# Patient Record
Sex: Male | Born: 1990 | Race: Black or African American | Hispanic: No | Marital: Single | Smoking: Never smoker
Health system: Southern US, Community
[De-identification: ages and names within clinical notes are randomized; demographics above are authoritative.]

## PROBLEM LIST (undated history)

## (undated) DIAGNOSIS — F32A Depression, unspecified: Secondary | ICD-10-CM

## (undated) DIAGNOSIS — J45909 Unspecified asthma, uncomplicated: Secondary | ICD-10-CM

## (undated) HISTORY — PX: OTHER SURGICAL HISTORY: SHX169

## (undated) HISTORY — DX: Depression, unspecified: F32.A

## (undated) HISTORY — DX: Unspecified asthma, uncomplicated: J45.909

---

## 2020-02-21 ENCOUNTER — Other Ambulatory Visit: Payer: Self-pay

## 2020-02-21 ENCOUNTER — Emergency Department (HOSPITAL_COMMUNITY)
Admission: EM | Admit: 2020-02-21 | Discharge: 2020-02-21 | Disposition: A | Discharge status: Home / Self Care | Payer: Self-pay | Attending: Emergency Medicine | Admitting: Emergency Medicine

## 2020-02-21 ENCOUNTER — Encounter (HOSPITAL_COMMUNITY): Payer: Self-pay | Admitting: Emergency Medicine

## 2020-02-21 ENCOUNTER — Emergency Department (HOSPITAL_COMMUNITY): Payer: Self-pay

## 2020-02-21 DIAGNOSIS — R0789 Other chest pain: Secondary | ICD-10-CM | POA: Insufficient documentation

## 2020-02-21 DIAGNOSIS — R42 Dizziness and giddiness: Secondary | ICD-10-CM | POA: Insufficient documentation

## 2020-02-21 DIAGNOSIS — R202 Paresthesia of skin: Secondary | ICD-10-CM | POA: Insufficient documentation

## 2020-02-21 DIAGNOSIS — R200 Anesthesia of skin: Secondary | ICD-10-CM | POA: Insufficient documentation

## 2020-02-21 DIAGNOSIS — R110 Nausea: Secondary | ICD-10-CM | POA: Insufficient documentation

## 2020-02-21 DIAGNOSIS — R002 Palpitations: Secondary | ICD-10-CM

## 2020-02-21 DIAGNOSIS — F419 Anxiety disorder, unspecified: Secondary | ICD-10-CM | POA: Insufficient documentation

## 2020-02-21 LAB — CBC
HCT: 48.4 % (ref 39.0–52.0)
Hemoglobin: 16.1 g/dL (ref 13.0–17.0)
MCH: 31.5 pg (ref 26.0–34.0)
MCHC: 33.3 g/dL (ref 30.0–36.0)
MCV: 94.7 fL (ref 80.0–100.0)
Platelets: 222 10*3/uL (ref 150–400)
RBC: 5.11 MIL/uL (ref 4.22–5.81)
RDW: 12.6 % (ref 11.5–15.5)
WBC: 6.3 10*3/uL (ref 4.0–10.5)
nRBC: 0 % (ref 0.0–0.2)

## 2020-02-21 LAB — BASIC METABOLIC PANEL
Anion gap: 13 (ref 5–15)
BUN: 5 mg/dL — ABNORMAL LOW (ref 6–20)
CO2: 24 mmol/L (ref 22–32)
Calcium: 9.3 mg/dL (ref 8.9–10.3)
Chloride: 102 mmol/L (ref 98–111)
Creatinine, Ser: 0.86 mg/dL (ref 0.61–1.24)
GFR calc Af Amer: >60 mL/min (ref >60)
GFR calc non Af Amer: >60 mL/min (ref >60)
Glucose, Bld: 99 mg/dL (ref 70–99)
Potassium: 3.7 mmol/L (ref 3.5–5.1)
Sodium: 139 mmol/L (ref 135–145)

## 2020-02-21 LAB — TROPONIN I (HIGH SENSITIVITY)
Troponin I (High Sensitivity): <2 ng/L (ref <18)
Troponin I (High Sensitivity): <2 ng/L (ref <18)

## 2020-02-21 LAB — TSH: TSH: 2.822 u[IU]/mL (ref 0.350–4.500)

## 2020-02-21 MED ORDER — SODIUM CHLORIDE 0.9% FLUSH: 3.0000 mL | Freq: Once | INTRAVENOUS | Status: DC

## 2020-02-21 NOTE — ED Notes (Signed)
Pt verbalized understanding of DC instructions and follow up care with cardiology/PCP. Ambulatory, refused wheelchair.

## 2020-02-21 NOTE — ED Provider Notes (Signed)
[facility]PART[facility] Provider Note   CSN: [id number] Arrival date & time: 02/21/20  [id number]     History Chief Complaint  Patient presents with  . Chest Pain    Keith Newman is a 29 y.o. male with no significant past medical history presents today for evaluation of acute onset, intermittent palpitations for a few months.  He reports episodes in which he feels more aware of his heartbeat and sometimes heart racing.  States that the majority of these episodes occur while exercising, typically doing cardio such as running or playing basketball but not weightlifting.  He states the episodes will come on and last for approximately 2 minutes before resolving with rest and "my adrenaline going down".  He states that during these episodes he will begin to feel lightheaded and will experience a sharp left upper chest pain.  Sometimes he will feel the palpitations in his head but denies significant headache.  No vision changes.  Earlier today at 82 AM while watching television he had a similar episode of palpitations and left-sided chest pains.  It lasted a little bit longer, approximately 7 minutes and he states "I think it lasted longer because I worked myself up".  He states that he then began to research his condition and when he read that he could experience numbness in his arm he began to feel left upper extremity numbness and tingling intermittently. Has not tried anything for his symptoms.  Presently endorses feeling anxious and a little nauseous but otherwise asymptomatic.  He denies recent travel or surgeries, hemoptysis, prior history of DVT or PE, and is not on hormone replacement.  He is a never smoker, denies recreational drug use or alcohol use.  He also denies excessive caffeine intake.  No family history of heart disease at a young age.  The history is provided by the patient.       History reviewed. No pertinent past medical history.  There are no problems  to display for this patient.   History reviewed. No pertinent surgical history.     No family history on file.  Social History   Tobacco Use  . Smoking status: Never Smoker  . Smokeless tobacco: Never Used  Substance Use Topics  . Alcohol use: Not Currently  . Drug use: Not Currently    Home Medications Prior to Admission medications   Not on File    Allergies    Patient has no allergy information on record.  Review of Systems   Review of Systems  Constitutional: Negative for chills and fever.  Respiratory: Positive for shortness of breath.   Cardiovascular: Positive for chest pain.  Gastrointestinal: Negative for abdominal pain, nausea and vomiting.  Neurological: Positive for light-headedness and numbness. Negative for weakness.  All other systems reviewed and are negative.   Physical Exam Updated Vital Signs BP 118/75 (BP Location: Left Arm)   Pulse 71   Temp 98.3 F (36.8 C) (Oral)   Resp 18   Ht 6\' 3"  (1.905 m)   Wt 77.1 kg   SpO2 99%   BMI 21.25 kg/m   Physical Exam Vitals and nursing note reviewed.  Constitutional:      General: He is not in acute distress.    Appearance: He is well-developed.  HENT:     Head: Normocephalic and atraumatic.  Eyes:     General:        Right eye: No discharge.        Left eye: No  discharge.     Conjunctiva/sclera: Conjunctivae normal.  Neck:     Vascular: No JVD.     Trachea: No tracheal deviation.  Cardiovascular:     Rate and Rhythm: Normal rate and regular rhythm.     Pulses:          Carotid pulses are 2+ on the right side and 2+ on the left side.      Radial pulses are 2+ on the right side and 2+ on the left side.       Dorsalis pedis pulses are 2+ on the right side and 2+ on the left side.       Posterior tibial pulses are 2+ on the right side and 2+ on the left side.     Heart sounds: Normal heart sounds.     Comments: Homans sign absent bilaterally, no lower extremity edema, no palpable cords,  compartments are soft  Pulmonary:     Effort: Pulmonary effort is normal.  Chest:     Chest wall: No tenderness.  Abdominal:     General: There is no distension.  Musculoskeletal:     Cervical back: Normal range of motion and neck supple.     Right lower leg: No tenderness. No edema.     Left lower leg: No tenderness. No edema.  Skin:    General: Skin is warm and dry.     Capillary Refill: Capillary refill takes less than 2 seconds.     Findings: No erythema.  Neurological:     Mental Status: He is alert.     Comments: Mental Status:  Alert, thought content appropriate, able to give a coherent history. Speech fluent without evidence of aphasia. Able to follow 2 step commands without difficulty.  Cranial Nerves:  II:  Peripheral visual fields grossly normal, pupils equal, round, reactive to light III,IV, VI: ptosis not present, extra-ocular motions intact bilaterally  V,VII: smile symmetric, facial light touch sensation equal VIII: hearing grossly normal to voice  X: uvula elevates symmetrically  XI: bilateral shoulder shrug symmetric and strong XII: midline tongue extension without fassiculations Motor:  Normal tone. 5/5 strength of BUE and BLE major muscle groups including strong and equal grip strength and dorsiflexion/plantar flexion Sensory: light touch normal in all extremities.   Psychiatric:        Behavior: Behavior normal.     ED Results / Procedures / Treatments   Labs (all labs ordered are listed, but only abnormal results are displayed) Labs Reviewed  BASIC METABOLIC PANEL - Abnormal; Notable for the following components:      Result Value   BUN 5 (*)    All other components within normal limits  CBC  TSH  TROPONIN I (HIGH SENSITIVITY)  TROPONIN I (HIGH SENSITIVITY)    EKG EKG Interpretation  Date/Time:  Wednesday February 21 2020 10:07:47 EDT Ventricular Rate:  89 PR Interval:  140 QRS Duration: 84 QT Interval:  338 QTC Calculation: 411 R Axis:    91 Text Interpretation: Normal sinus rhythm with sinus arrhythmia Rightward axis Borderline ECG artifact in multiple leads Confirmed by Davonna Belling [phone]) on 02/21/2020 11:14:00 AM   Radiology DG Chest 2 View  Result Date: 02/21/2020 CLINICAL DATA:  Chest pain. EXAM: CHEST - 2 VIEW COMPARISON:  None. FINDINGS: The heart size and mediastinal contours are within normal limits. The lungs are clear. The visualized skeletal structures are unremarkable. IMPRESSION: Negative chest. Electronically Signed   By: Inge Rise M.D.   On: 02/21/2020 10:46  Procedures Procedures (including critical care time)  Medications Ordered in ED Medications  sodium chloride flush (NS) 0.9 % injection 3 mL (3 mLs Intravenous Not Given 02/21/20 1131)    ED Course  I have reviewed the triage vital signs and the nursing notes.  Pertinent labs & imaging results that were available during my care of the patient were reviewed by me and considered in my medical decision making (see chart for details).    MDM Rules/Calculators/A&P                      Patient presenting for evaluation of intermittent palpitations and left-sided chest pains for several months.  Typically comes on with exertion but today happened at rest.  He is asymptomatic upon my assessment.  He is afebrile, vital signs are stable.  He is nontoxic but anxious in appearance.  The pain is not reproducible on palpation.  Lab work reviewed and interpreted by myself shows no leukocytosis, no anemia, no metabolic derangements, no renal insufficiency.  His TSH is within normal limits.  His EKG shows normal sinus rhythm with no acute ischemic abnormalities and repeat EKG is stable and serial troponins are negative.  Doubt ACS/MI at this time.  His pulses are equal and brisk bilaterally, doubt coarctation of the aorta.  His chest x-ray shows no acute cardiopulmonary abnormalities, no cardiomegaly or widened mediastinum.  Doubt dissection, cardiac  tamponade, esophageal rupture.  On reevaluation the patient is resting comfortably in no apparent distress.  His sister is now at the bedside and she does note that their mother has a history of enlarged heart but she is unsure when this was diagnosed.  I encouraged the patient to hold off on any exertional activity until he follows up with PCP or cardiology on an outpatient basis.  We will give him resources for follow-up with [facility] wellness.  Discussed strict ED return precautions. Patient and sister verbalized understanding of and agreement with plan and is safe for discharge home at this time.    Final Clinical Impression(s) / ED Diagnoses Final diagnoses:  Atypical chest pain  Palpitations    Rx / DC Orders ED Discharge Orders         Ordered    Ambulatory referral to Cardiology    Comments: Palpitations and lightheadedness with exercise   02/21/20 1509           Renita Papa, PA-C 02/21/20 1545    Davonna Belling, MD 02/22/20 [phone]

## 2020-02-21 NOTE — Discharge Instructions (Signed)
Your work-up today was reassuring.  However I would recommend that you stop doing any kind of significant physical activity/exertion until follow-up with PCP or cardiology for reevaluation.   Call [facility] today or first thing tomorrow to schedule follow-up appointment.  I have also put in referral to our cardiologists.  They should call you within around 48 hours to schedule follow-up.  If you do not hear from them you can call to schedule follow-up.  Return to the emergency department if any concerning signs or symptoms develop such as lightheadedness, shortness of breath, severe pains, loss of consciousness.

## 2020-02-21 NOTE — ED Triage Notes (Signed)
Pt states he has been having "palpitations in his head" after exercising x 2 months.  States this morning he had sharp 3/10 L sided chest pain that started around 6am and lasted for approx 2 hours.  Also reports L arm numbness that lasted for a few minutes.  No arm drift.

## 2020-02-29 ENCOUNTER — Encounter: Payer: Self-pay | Admitting: Family Medicine

## 2020-02-29 ENCOUNTER — Ambulatory Visit: Payer: Self-pay | Attending: Family Medicine | Admitting: Family Medicine

## 2020-02-29 ENCOUNTER — Other Ambulatory Visit: Payer: Self-pay

## 2020-02-29 DIAGNOSIS — R002 Palpitations: Secondary | ICD-10-CM

## 2020-02-29 DIAGNOSIS — Z09 Encounter for follow-up examination after completed treatment for conditions other than malignant neoplasm: Secondary | ICD-10-CM

## 2020-02-29 DIAGNOSIS — G43909 Migraine, unspecified, not intractable, without status migrainosus: Secondary | ICD-10-CM | POA: Insufficient documentation

## 2020-02-29 DIAGNOSIS — J4520 Mild intermittent asthma, uncomplicated: Secondary | ICD-10-CM

## 2020-02-29 DIAGNOSIS — G43709 Chronic migraine without aura, not intractable, without status migrainosus: Secondary | ICD-10-CM

## 2020-02-29 DIAGNOSIS — Z7689 Persons encountering health services in other specified circumstances: Secondary | ICD-10-CM

## 2020-02-29 DIAGNOSIS — R0789 Other chest pain: Secondary | ICD-10-CM

## 2020-02-29 MED ORDER — ALBUTEROL SULFATE HFA 108 (90 BASE) MCG/ACT IN AERS: 2.0000 | INHALATION_SPRAY | Freq: Four times a day (QID) | RESPIRATORY_TRACT | 11 refills | Status: AC | PRN

## 2020-02-29 MED FILL — !VENTOLIN HFA INHALER: 108 (90 BAS | 25 days supply | Qty: 18 | Fill #0

## 2020-02-29 NOTE — Progress Notes (Signed)
Virtual Visit via Telephone Note  I connected with Keith Newman on 02/29/20 at  9:30 AM EDT by telephone and verified that I am speaking with the correct person using two identifiers.   I discussed the limitations, risks, security and privacy concerns of performing an evaluation and management service by telephone and the availability of in person appointments. I also discussed with the patient that there may be a patient responsible charge related to this service. The patient expressed understanding and agreed to proceed.  Patient Location: Home Provider Location: [facility] Others participating in call: none  Call placed to patient at 9:47 am but no one answered. Keith Grain, MD  History of Present Illness:        29 yo male new to the practice who is status post ED visit on 02/21/2020 due to left sided chest pain and numbness in his left arm. He was afraid that he might be having a heart attack. He was also having occasional palpitations. Since his ED visit, he will still have random sharp in his left arm but this does not go away until he repositions himself or lies down on his right side.  The numbness sensation is mostly in the left shoulder and biceps area.  No numbness or tingling in the hand.  When he had the episode of chest pain or when he has palpitations, he does not have any sweating/diaphoresis and no nausea.  No radiation of pain into the throat, neck or jaw or upper back.         He reports past medical history of migraines which started about a year ago and when he has a migraine headache it starts at the top of his head and he has sensitivity to light.  He currently does not take any medication to help with the migraines but did take medication a year ago.  He reports that he may have up to 10 migraine type headaches in a 30-day period.  He sometimes also has nausea with his migraine type headaches.  He does not get a warning that the headaches are about to occur but headaches will  start more gradually and then increase in intensity.         He also has past medical history significant for asthma.  He states that he had more issues when he was younger with asthma but now his asthma is not really a factor.  Asthma can occur if he has been very active.  He has noted recent increase in shortness of breath if he is doing an activity such as going up or down stairs but denies any chest tightness or wheezing.  He does not currently have an albuterol inhaler will but would like to have one sent to pharmacy.          When he has palpitations, these usually occur with exertion/increase level of activity.  Palpitations feel as if he can hear his heartbeat in his head and he feels the sensation of palpitations throughout his body.  He reports no family history of heart disease or heart attack but states that his mother was recently diagnosed with an enlarged heart after she was seen in the emergency department.  Patient's mother has hypertension and diabetes.  Past Medical History:  Diagnosis Date  . Asthma     History reviewed. No pertinent surgical history.  Family History  Problem Relation Age of Onset  . Prostate cancer Paternal Uncle     Social History  Tobacco Use  . Smoking status: Never Smoker  . Smokeless tobacco: Never Used  Substance Use Topics  . Alcohol use: Not Currently  . Drug use: Not Currently     No Known Allergies     Observations/Objective: No vital signs or physical exam conducted as visit was done via telephone  Assessment and Plan: 1. Atypical chest pain 2. Palpitations 6.  Encounter for examination following treatment at hospital; 7.  Encounter to establish care Emergency department records from patient's ED visit on 02/21/2020 reviewed and discussed with the patient and he will be referred to cardiology for further evaluation and treatment.  He has also been asked to make follow-up appointment here in the office for in person evaluation as his  shoulder pain and numbness may be musculoskeletal in nature. - Ambulatory referral to Cardiology  3. Chronic migraine without aura without status migrainosus, not intractable Patient may try over-the-counter medicine such as Excedrin Migraine to see if this helps with his recurrent headaches.  He has been asked to follow-up in office for further evaluation of headaches.  4. Mild intermittent asthma without complication Prescription provided for albuterol inhaler to use as needed for mild intermittent asthma - albuterol (VENTOLIN HFA) 108 (90 Base) MCG/ACT inhaler; Inhale 2 puffs into the lungs every 6 (six) hours as needed for wheezing or shortness of breath.  Dispense: 18 g; Refill: 11  Follow Up Instructions:Return in about 5 weeks (around 04/04/2020) for Follow-up of chest pain/palpitations/migraines and asthma;.sooner if needed    I discussed the assessment and treatment plan with the patient. The patient was provided an opportunity to ask questions and all were answered. The patient agreed with the plan and demonstrated an understanding of the instructions.   The patient was advised to call back or seek an in-person evaluation if the symptoms worsen or if the condition fails to improve as anticipated.  I provided 18 minutes of non-face-to-face time during this encounter. Additional time spent for review of chart, placement of orders and completion of today's visit, approximately 10 minutes  Keith Wellons, MD

## 2020-02-29 NOTE — Progress Notes (Signed)
hfu   Per pt he was dx with asthma but have not been using any inhaler  Shark pain in his back about 5 since his first emergency check up and its been intermittent

## 2020-03-04 ENCOUNTER — Encounter: Payer: Self-pay | Admitting: General Practice

## 2020-03-19 ENCOUNTER — Encounter: Payer: Self-pay | Admitting: Family

## 2020-03-19 ENCOUNTER — Ambulatory Visit: Payer: Self-pay | Attending: Family | Admitting: Family

## 2020-03-19 ENCOUNTER — Other Ambulatory Visit: Payer: Self-pay

## 2020-03-19 VITALS — BP 124/76 | HR 85 | Temp 98.6°F | Resp 16 | Ht 75.0 in | Wt 193.0 lb

## 2020-03-19 DIAGNOSIS — G43709 Chronic migraine without aura, not intractable, without status migrainosus: Secondary | ICD-10-CM

## 2020-03-19 DIAGNOSIS — R002 Palpitations: Secondary | ICD-10-CM

## 2020-03-19 DIAGNOSIS — F329 Major depressive disorder, single episode, unspecified: Secondary | ICD-10-CM

## 2020-03-19 DIAGNOSIS — F419 Anxiety disorder, unspecified: Secondary | ICD-10-CM

## 2020-03-19 DIAGNOSIS — R0789 Other chest pain: Secondary | ICD-10-CM

## 2020-03-19 NOTE — Patient Instructions (Signed)
Referral to cardiology for atypical chest pain. Excedrin Migraine for migraines. Follow-up with primary doctor as needed. Chest Wall Pain Chest wall pain is pain in or around the bones and muscles of your chest. Chest wall pain may be caused by:  An injury.  Coughing a lot.  Using your chest and arm muscles too much. Sometimes, the cause may not be known. This pain may take a few weeks or longer to get better. Follow these instructions at home: Managing pain, stiffness, and swelling If told, put ice on the painful area:  Put ice in a plastic bag.  Place a towel between your skin and the bag.  Leave the ice on for 20 minutes, 2-3 times a day.  Activity  Rest as told by your doctor.  Avoid doing things that cause pain. This includes lifting heavy items.  Ask your doctor what activities are safe for you. General instructions   Take over-the-counter and prescription medicines only as told by your doctor.  Do not use any products that contain nicotine or tobacco, such as cigarettes, e-cigarettes, and chewing tobacco. If you need help quitting, ask your doctor.  Keep all follow-up visits as told by your doctor. This is important. Contact a doctor if:  You have a fever.  Your chest pain gets worse.  You have new symptoms. Get help right away if:  You feel sick to your stomach (nauseous) or you throw up (vomit).  You feel sweaty or light-headed.  You have a cough with mucus from your lungs (sputum) or you cough up blood.  You are short of breath. These symptoms may be an emergency. Do not wait to see if the symptoms will go away. Get medical help right away. Call your local emergency services (911 in the [place] Do not drive yourself to the hospital. Summary  Chest wall pain is pain in or around the bones and muscles of your chest.  It may be treated with ice, rest, and medicines. Your condition may also get better if you avoid doing things that cause pain.  Contact a  doctor if you have a fever, chest pain that gets worse, or new symptoms.  Get help right away if you feel light-headed or you get short of breath. These symptoms may be an emergency. This information is not intended to replace advice given to you by your health care provider. Make sure you discuss any questions you have with your health care provider. Document Revised: 05/26/2018 Document Reviewed: 05/26/2018 Elsevier Patient Education  2020 [employer].

## 2020-03-19 NOTE — Progress Notes (Signed)
Here for f /u chest  palpitation   C /o few seconds of various episodes of L eye visual disturbances with loss of Periferal and flashes X 6 wk  Made NP aware of anxiety and depression  screening questionaire

## 2020-03-19 NOTE — Progress Notes (Signed)
Patient ID: Keith Newman, male    DOB: October 26, 1991  MRN: [id number]  CC: Chest pain follow-up   Subjective: Keith Newman is a 29 y.o. male with history of migraines and asthma who presents for chest pain follow-up.  1. CHEST PAIN FOLLOW-UP: Location: left side of chest Description: 2/10 pain rating. Reports not really painful just scares him when it happens Onset: February 2021  Symptoms Left shoulder pain, numbness, tingling: denies Trauma: denies Nausea/vomiting: denies  Diaphoresis: denies Palpitations: yes, sometimes especially if working out Shortness of breath: yes, sometimes Cough: denies Edema: denies Orthopnea: denies Syncope: denies Indigestion: denies  Red Flags Worse with exertion: Yes, especially if working out. Reports he has not worked out since last visit in March 2021 as advised from the doctor at the hospital. Recent Immobility: denies Cancer history: denies Tearing/radiation to back: denies   Comments: Today not having any chest pain. Chest pain last happened 2 weeks ago which he describes as feeling like he was punched in the chest. Reports chest pain does not happen often stating sometimes a week can go by without chest pain and then randomly happens. States chest pain comes and goes. Reports typically has a rapid heartbeat especially while working out. Reports a few months ago while watching a cartoon on television one of the characters said he felt like he was having a heart attack and he immediately felt he was having a heart attack as well with left chest pain.  Last visit March 2021 with Dr. Chapman Fitch. During that encounter patient referred to cardiology for further evaluation and treatment of chest pain. Patient was also asked to make a follow-up appointment for shoulder pain and numbness which may have been suggestive of musculoskeletal in nature. Today patient reports that he has not been seen by cardiology as of yet and no longer having any shoulder  pain or numbness.    2. MIGRAINE FOLLOW-UP:  Description of Headaches:  Location of pain: reports headache location varies from front, side, and back of head Radiation of pain?:neck and left side of chest Character of pain:pulsating Severity of pain: 8 Accompanying symptoms: none Prodromal sx?: none Rapidity of onset: sudden Typical duration of individual headache: 3 minutes and comes and goes Are most headaches similar in presentation? yes Comments: At today's visit he does not have a headache. Reports when migraines happens they are strong and feels like he may pass out but he never does. Reports when laying down can feel hearbeat in the back of head and in the stomach. Reports does have ringing in bilateral ears which happens randomly. Reports when he moves his head right eye vision turns completely white and left eye vision goes completely black. Reports once that passes vision in bilateral eyes return to normal. Reports sometimes he sees colors like blue and yellow when they really aren't there or a giant dark circle in his peripheral area. Patient reports last week he went to see an eye doctor and was told that he does not have retinal detachment or glaucoma as he has family history of dad with glaucoma. Reports during his visit at the eye doctor his vision was determined to be good.  Last visit March 2021 with Dr. Chapman Fitch. During that encounter it was recommended patient try over-the-counter medication such as Excedrin Migraine to see if it helps recurrent headaches and to follow-up as needed in office. Reports did not try Excedrin Migraine since last visit or any additional medications over-the-counter. Patient reports he will try  Excedrin Migraine starting after this visit.  3. ANXIETY AND DEPRESSION:  Patient reports anxiety began around September 2020 when his vision started to change such as seeing colors and images which really weren't there. Reports anxiety became worse in February 2021  when he began to have chest pain. States that he is worried that he will lose his vision completely and wake up one day and be unable to see. Also feels worried that he will have a heart attack. Says that he is concerned that the migraines are actually a brain tumor and wonders if he should see a neurologist to make sure that he is okay. Says that he looks up most of his symptoms on Google and it worries him because he feels like in most cases Google says a person will die if they have some of the things he looks up. States that he knows its bad to keep looking on Google but he cant stop.  Reports depression began during the school years as a child. Reports that he was never felt that he fit in with other children his age. States that he has never been able to keep friendships for long because the always "flake" on him. Reports depression heightened as he entered the college years. Reports that he had plans to play basketball but his history of asthma prevented him from doing so in combination with a knee/leg injury. Reports that he currently attends [employer] and originally had plans to graduate spring 2021. Reports however he will not be able to graduate as intended because of academic and financial reasons and will more than likely have to attend college for an additional semester. States that he also feels depressed because he feels as if he is getting older and had intentions of being married by the age of 75 with at least a couple of children by now. Reports that he never wanted to live his 20's as the "fun years" but he wanted to be settled down as a family man and concerned that he may be a father for the first time at or after the age of 69.   States that his support system are his mother and 5 sisters.   Denies thoughts and plans self-harm and suicidal ideation. Not interested in medication at this time.  GAD 7 : Generalized Anxiety Score 03/19/2020 02/29/2020  Nervous, Anxious, on  Edge 3 1  Control/stop worrying 3 1  Worry too much - different things 3 1  Trouble relaxing 3 0  Restless 0 0  Easily annoyed or irritable 0 0  Afraid - awful might happen 3 1  Total GAD 7 Score 15 4  Anxiety Difficulty - Somewhat difficult   Depression screen [facility] 2/9 03/19/2020 02/29/2020  Decreased Interest 3 0  Down, Depressed, Hopeless 3 0  PHQ - 2 Score 6 0  Altered sleeping 0 0  Tired, decreased energy 0 0  Change in appetite 0 0  Feeling bad or failure about yourself  1 1  Trouble concentrating 0 0  Moving slowly or fidgety/restless 0 0  Suicidal thoughts 0 0  PHQ-9 Score 7 1  Difficult doing work/chores - Not difficult at all   Patient Active Problem List   Diagnosis Date Noted  . Migraines 02/29/2020     Current Outpatient Medications on File Prior to Visit  Medication Sig Dispense Refill  . albuterol (VENTOLIN HFA) 108 (90 Base) MCG/ACT inhaler Inhale 2 puffs into the lungs every 6 (six) hours as  needed for wheezing or shortness of breath. 18 g 11   No current facility-administered medications on file prior to visit.    No Known Allergies  Social History   Socioeconomic History  . Marital status: Single    Spouse name: Not on file  . Number of children: Not on file  . Years of education: Not on file  . Highest education level: Not on file  Occupational History  . Not on file  Tobacco Use  . Smoking status: Never Smoker  . Smokeless tobacco: Never Used  Substance and Sexual Activity  . Alcohol use: Not Currently  . Drug use: Not Currently  . Sexual activity: Not on file  Other Topics Concern  . Not on file  Social History Narrative  . Not on file   Social Determinants of Health   Financial Resource Strain:   . Difficulty of Paying Living Expenses:   Food Insecurity:   . Worried About Charity fundraiser in the Last Year:   . Arboriculturist in the Last Year:   Transportation Needs:   . Film/video editor (Medical):   Marland Kitchen Lack of  Transportation (Non-Medical):   Physical Activity:   . Days of Exercise per Week:   . Minutes of Exercise per Session:   Stress:   . Feeling of Stress :   Social Connections:   . Frequency of Communication with Friends and Family:   . Frequency of Social Gatherings with Friends and Family:   . Attends Religious Services:   . Active Member of Clubs or Organizations:   . Attends Archivist Meetings:   Marland Kitchen Marital Status:   Intimate Partner Violence:   . Fear of Current or Ex-Partner:   . Emotionally Abused:   Marland Kitchen Physically Abused:   . Sexually Abused:     Family History  Problem Relation Age of Onset  . Diabetes Mother   . Hypertension Mother   . Prostate cancer Paternal Uncle   . Leukemia Maternal Grandmother   . Heart attack Neg Hx   . Stroke Neg Hx     Past Surgical History:  Procedure Laterality Date  . no past surgery      ROS: Review of Systems Negative except as stated above  PHYSICAL EXAM: Vitals with BMI 03/19/2020 02/21/2020 02/21/2020  Height 6\' 3"  - 6\' 3"   Weight 193 lbs - 170 lbs  BMI [id number] - A999333  Systolic [id number] -  Diastolic 76 75 -  Pulse 85 71 -  SpO2- 99%, room air Temperature- 98.6 F, oral   Physical Exam General appearance - oriented to person, place, and time and anxious Mental status - alert, oriented to person, place, and time, normal speech, dress, motor activity, and thought processes, anxious Eyes - pupils equal and reactive, extraocular eye movements intact, funduscopic exam normal, discs flat and sharp Neck - supple, no significant adenopathy Lymphatics - no palpable lymphadenopathy, no hepatosplenomegaly Chest - clear to auscultation, no wheezes, rales or rhonchi, symmetric air entry, no tachypnea, retractions or cyanosis Heart - normal rate, regular rhythm, normal S1, S2, no murmurs, rubs, clicks or gallops Neurological - alert, oriented, normal speech, no focal findings or movement disorder noted, neck supple without  rigidity, cranial nerves II through XII intact, funduscopic exam normal, discs flat and sharp, DTR's normal and symmetric, motor and sensory grossly normal bilaterally, normal muscle tone, no tremors, strength 5/5, Romberg sign negative, normal gait and station  ASSESSMENT AND PLAN: 1. Atypical  chest pain: -Counseled patient to see cardiologist as recommended at last visit in March 2021. Order for cardiologist referral originally placed 02/29/2020 by primary provider Dr. Chapman Fitch. I have confirmed today with referral coordinator that cardiologist attempted to reach patient  twice for appointment in March 2021 with no success. Today referral coordinator made appointment for patient with cardiologist at [facility] in [place] on 04/03/2020 at 4 PM. The details of cardiologist appointment shared with patient from referral coordinator.  -Last EKG 02/21/2020 at the [facility] emergency department with Dr. Alvino Chapel resulted normal sinus rhythm with no acute ischemic abnormalities with repeat EKG stable and serial troponins were negative. During the same encounter chest x-ray resulted no acute cardiopulmonary abnormalities, no cardiomegaly, or widened mediastium. -Patient was given clear instructions to go to Emergency Department or return to medical center if symptoms don't improve, worsen, or new problems develop.The patient verbalized understanding. -Patient does not have health insurance offered patient the [place] discount/orange card application. Patient agreeable  2. Palpitations: -see #1   3. Chronic migraine without aura without status migrainosus, not intractable:  -Counseled patient to try course of over-the-counter Excedrin Migraine to see if this helps as recommended at his last visit in March 2021. Patient was agreeable. Other options to try instead are over-the-counter Tylenol or Ibuprofen. -If course of over-the-counter medications do not help then  may consider an abortive and/or preventive medication for migraines. Patient agreeable.  -Follow-up with primary doctor as needed. -Patient was given clear instructions to go to Emergency Department or return to medical center if symptoms don't improve, worsen, or new problems develop.The patient verbalized understanding.  4. Anxiety and depression: -Patient's GAD and PHQ9 screening results indicative of anxiety and depression. Patient not considering medication at this time and does not have any thoughts/plans of self-harm or suicidal ideation.Referral to social work for further assistance at this time. - Ambulatory referral to Social Work -[place]:   What if I or someone I know is in crisis?  . If you are thinking about harming yourself or having thoughts of suicide, or if you know someone who is, seek help right away.  . Call your doctor or mental health care provider.  . Call 911 or go to a hospital emergency room to get immediate help, or ask a friend or family member to help you do these things.  . Call the [place] National Suicide Prevention Lifeline's toll-free, 24-hour hotline at 1-800-273-TALK 585-538-1789) or TTY: 1-800-799-4 TTY (210)242-4113) to talk to a trained counselor.  . If you are in crisis, make sure you are not left alone.   . If someone else is in crisis, make sure he or she is not left alone   24 Hour :   [place] National Suicide Hotline: (209)301-8036  Therapeutic Alternative Mobile Crisis: 220-282-3483   [facility]  [address], [place], [place] [address]  [phone] or (620)[phone]  [facility] [employer] (Domestic Violence, Rape & Victim Assistance)  [phone]  [place]  [address], [place]  [address]   [phone] or [phone]   [place]: [phone] (8am-4pm) or [phone] (after hours)   [facility], [address], [place], [place] Fax: [phone] [email or web address]  *Interpreters available *Accepts Medicaid, Medicare, uninsured  [facility]   Mon-Fri: 8am-5pm [address] [place], [place], [place] [phone](phone); [phone])  [email or web address]  *Accepts Medicare  [facility] [place], Fri: 8am-4pm [address], [place], [place] (phone); [phone] (fax) TaskTown.es  *Accepts Medicare  [facility] Mon-Fri: 9am-5pm  [address] [place], [place] (phone); [phone]  [email or web address]  *Accepts Medicaid  [facility] [address] [place], [place]  SalonLookup.es   [facility] [place], 8:30am-12pm/1pm-2:30pm [address], [place], [place] (phone); [phone] (fax) www.fspcares.org  *Accepts Medicaid, sliding-scale*Bilingual services available  [facility] Mon-Fri, 8am-7pm [place], [place]  [phone](phone); ([phone]) [phone]) www.[email or web address]  *Accepts Medicaid *Bilingual services available  [facility] Mon-Fri: 8am-5pm, Saturday by appointment only [place], [place], [place] (phone); [phone] (fax) [email or web address]   [facility] [address], [place], [place], [place] [email or web address]  *Free & reduced services for uninsured and underinsured individuals *Bilingual services for Spanish-speaking clients 21 and under  [facility], [address] [place], [place]);  [phone]) RunningConvention.[email or web address]  *Bring your own interpreter at first visit *Accepts Medicare and [facility]  [place] Mon-Fri: 9am-5:30pm [address] [place], [place], [place] (phone), [phone] (fax) After hours crisis line: ([phone] www.[email or web address]  *Accepts Medicare and Medicaid  [employer], 8am-6pm [address] [place], [place] (phone); ([phone] (fax) http://[email or web address]  *Subsidized costs available  [facility] Mon-Fri: 8am-4pm [address] [place], [place] [phone](phone); [phone]) www.p[email or web address]  *Accepts Medicaid  [facility] Same day access hours: Mon-Fri, 8:30-3pm Crisis hours: Mon-Fri, 8am-5pm [place], [place] Same day access hours: Mon-Fri, 8:30-3pm Crisis hours: Mon-Fri, 8am-8pm [address] [place], [place] (phone); [phone] (fax) www.[email or web address]  *Accepts Medicaid and Medicare  [facility] [facility], [place], Fri: 9am-9pm Tues, Thurs: 9am-6pm [place], [place], [place] (phone); [phone] (fax) https://[email or web address]  *(Accepts Medicare and Medicaid; payment plans available)*Bilingual services available  [facility] [address] [place], [place] (phone); [phone] (fax) [email or web address] [address], [place], [place], [place]  [email or web address]  *Bilingual services available  [facility]) Mon-Thurs: 8am-8pm [address] [place], [place], [place] (phone); [phone] (fax) [email or web address]  *Accepts Medicaid*Bilingual services available  [place] [address] [place], [place], [place] (phone) DeadConnect.[email or web address]  *Accepts Medicaid *Bilingual services available  [facility] Mon-Fri, 9am-4:45pm [address], [place], [place] (phone); [phone] (fax) http://[email or web address]-[email or web address]  *Accepts Medicare  [facility] Mon-Thurs: 8:30-8pm, Fri: 8:30am-7pm [address], [place], [place] (3rd floor) [phone] (phone); [phone] (fax) VIPinterview.[email or web address]  *Accepts Medicaid; income-based reduced rates available  [facility] Mon-Fri: 8am-5pm [address] [place], [place], [place] (phone); [phone] (fax) [email or web address]://[email or web address]  *Accepts Medicaid*Bilingual services available  [place], [place], [place], [place] (phone); [phone] (fax) www.[email or web address]  *Free emergency housing and clinical services for youth in crisis  [facility] ([place])  [address] [place] [phone] www.mhag.org  *Provides direct services to individuals in recovery from mental illness, including support groups, recovery skills classes, and one on one peer support  [employer] [employer] on [place]) [place] helpline: 516-239-9959  https://namiguilford.org  *A community hub for information relating to local resources and services for the friends and families of individuals living alongside a mental health condition, as well as  the individuals themselves. Classes and support groups also provided   Patient was given the opportunity to ask questions.  Patient verbalized understanding of the plan and was able to repeat key elements of the plan. Patient was given clear instructions to go to Emergency Department or return to medical center if symptoms don't improve, worsen, or new problems develop.The patient verbalized understanding.   Requested  Prescriptions    No prescriptions requested or ordered in this encounter    Kamaya Keckler Zachery Dauer, NP

## 2020-03-27 ENCOUNTER — Telehealth: Payer: Self-pay | Admitting: Licensed Clinical Social Worker

## 2020-03-27 NOTE — Telephone Encounter (Signed)
Call placed to patient regarding [facility] referral. There was no option for LCSW to leave message.

## 2020-04-03 ENCOUNTER — Ambulatory Visit: Payer: Self-pay | Admitting: Cardiology

## 2020-04-13 ENCOUNTER — Emergency Department (HOSPITAL_COMMUNITY)
Admission: EM | Admit: 2020-04-13 | Discharge: 2020-04-13 | Disposition: A | Discharge status: Home / Self Care | Payer: Self-pay | Attending: Emergency Medicine | Admitting: Emergency Medicine

## 2020-04-13 ENCOUNTER — Other Ambulatory Visit: Payer: Self-pay

## 2020-04-13 DIAGNOSIS — J45909 Unspecified asthma, uncomplicated: Secondary | ICD-10-CM | POA: Insufficient documentation

## 2020-04-13 DIAGNOSIS — R109 Unspecified abdominal pain: Secondary | ICD-10-CM | POA: Insufficient documentation

## 2020-04-13 DIAGNOSIS — F418 Other specified anxiety disorders: Secondary | ICD-10-CM | POA: Insufficient documentation

## 2020-04-13 DIAGNOSIS — H538 Other visual disturbances: Secondary | ICD-10-CM | POA: Insufficient documentation

## 2020-04-13 DIAGNOSIS — R200 Anesthesia of skin: Secondary | ICD-10-CM | POA: Insufficient documentation

## 2020-04-13 LAB — I-STAT CHEM 8, ED
BUN: 4 mg/dL — ABNORMAL LOW (ref 6–20)
Calcium, Ion: 1.19 mmol/L (ref 1.15–1.40)
Chloride: 102 mmol/L (ref 98–111)
Creatinine, Ser: 0.9 mg/dL (ref 0.61–1.24)
Glucose, Bld: 93 mg/dL (ref 70–99)
HCT: 44 % (ref 39.0–52.0)
Hemoglobin: 15 g/dL (ref 13.0–17.0)
Potassium: 3.5 mmol/L (ref 3.5–5.1)
Sodium: 140 mmol/L (ref 135–145)
TCO2: 26 mmol/L (ref 22–32)

## 2020-04-13 LAB — CBG MONITORING, ED: Glucose-Capillary: 96 mg/dL (ref 70–99)

## 2020-04-13 MED ORDER — HYDROXYZINE HCL 25 MG PO TABS: 25.0000 mg | ORAL_TABLET | Freq: Three times a day (TID) | ORAL | 0 refills | Status: AC | PRN

## 2020-04-13 NOTE — ED Triage Notes (Addendum)
C/o numbness to L cheek that moved to L ear and then chin since yesterday.  No arm drift.  Difficulty getting pt to smile to assess for facial droop.    Pt initially reports "swelling" to L face and when questioned pt he denies swelling and states numbness.  Then he reported swelling again.  States he went to [facility] 2 weeks ago due to seeing "flashes of color".  Pt states he was told it was a migraine.  Pt states he is concerned he has a brain tumor or had a stroke.  States he has been having problems reading and skipping over words when he talks.  When questioned pt about pain he states the L side of his face is throbbing.  Questioned patient if he is having numbness or pain and he states it was numbness on the way to hospital and now it is pain.

## 2020-04-13 NOTE — ED Provider Notes (Signed)
[facility] EMERGENCY DEPARTMENT Provider Note   CSN: [id number] Arrival date & time: 04/13/20  1802     History Chief Complaint  Patient presents with  . Numbness    Keith Newman is a 29 y.o. male.  HPI 29 year old male resents with multiple complaints: Left-sided throbbing headaches, treated with Advil, laying down, dimming the lights.  He has been diagnosed with migraine headaches.  They were worse several months ago.  Most recently, he had characteristic migraine headache yesterday.  At times, he has transient areas of color changes in his visual fields.  He had some left-sided facial numbness earlier today.  He denies any current sensation of numbness.  He reports that he has been going online frequently to self diagnose his symptoms.  He endorses significant anxiety related to possibility of brain tumors and/or stroke.  He has intermittent abdominal discomfort and is worried about bowel obstruction.  He has been having normal bowel movements, most recently this morning.  He has not had any fevers, chills, nausea, or vomiting.  He denies any illicit drug or alcohol use.  He has also been worried that he is diabetic.    Past Medical History:  Diagnosis Date  . Asthma     Patient Active Problem List   Diagnosis Date Noted  . Migraines 02/29/2020    Past Surgical History:  Procedure Laterality Date  . no past surgery         Family History  Problem Relation Age of Onset  . Diabetes Mother   . Hypertension Mother   . Prostate cancer Paternal Uncle   . Leukemia Maternal Grandmother   . Heart attack Neg Hx   . Stroke Neg Hx     Social History   Tobacco Use  . Smoking status: Never Smoker  . Smokeless tobacco: Never Used  Substance Use Topics  . Alcohol use: Not Currently  . Drug use: Not Currently    Home Medications Prior to Admission medications   Medication Sig Start Date End Date Taking? Authorizing Provider  albuterol (VENTOLIN HFA)  108 (90 Base) MCG/ACT inhaler Inhale 2 puffs into the lungs every 6 (six) hours as needed for wheezing or shortness of breath. 02/29/20   Fulp, Cammie, MD  hydrOXYzine (ATARAX/VISTARIL) 25 MG tablet Take 1 tablet (25 mg total) by mouth every 8 (eight) hours as needed for anxiety. 04/13/20   Godfrey Pick, MD    Allergies    Patient has no known allergies.  Review of Systems   Review of Systems  Constitutional: Negative for activity change, appetite change, chills, diaphoresis, fatigue and fever.  HENT: Negative for congestion, ear pain, facial swelling, hearing loss, rhinorrhea, sore throat and trouble swallowing.   Eyes: Positive for visual disturbance. Negative for photophobia, pain, discharge and redness.  Respiratory: Negative for choking, chest tightness, shortness of breath and wheezing.   Cardiovascular: Negative for chest pain, palpitations and leg swelling.  Gastrointestinal: Negative for constipation, diarrhea, nausea and vomiting.  Genitourinary: Negative for dysuria and flank pain.  Musculoskeletal: Negative for arthralgias, back pain, gait problem, joint swelling, myalgias, neck pain and neck stiffness.  Skin: Negative for pallor, rash and wound.  Neurological: Positive for numbness and headaches. Negative for dizziness, seizures, syncope, facial asymmetry, speech difficulty, weakness and light-headedness.  Hematological: Does not bruise/bleed easily.  Psychiatric/Behavioral: Negative for confusion, decreased concentration and hallucinations. The patient is nervous/anxious.     Physical Exam Updated Vital Signs BP 133/76 (BP Location: Left Arm)   Pulse  70   Temp 98 F (36.7 C) (Oral)   Resp 14   SpO2 97%   Physical Exam Vitals and nursing note reviewed.  Constitutional:      General: He is not in acute distress.    Appearance: Normal appearance. He is well-developed and normal weight. He is not ill-appearing, toxic-appearing or diaphoretic.  HENT:     Head: Normocephalic  and atraumatic.     Right Ear: External ear normal.     Left Ear: External ear normal.     Nose: Nose normal. No congestion or rhinorrhea.     Mouth/Throat:     Mouth: Mucous membranes are moist.     Pharynx: Oropharynx is clear.  Eyes:     General: No visual field deficit or scleral icterus.       Right eye: No discharge.        Left eye: No discharge.     Extraocular Movements: Extraocular movements intact.     Conjunctiva/sclera: Conjunctivae normal.     Pupils: Pupils are equal, round, and reactive to light.  Cardiovascular:     Rate and Rhythm: Normal rate and regular rhythm.     Heart sounds: No murmur.  Pulmonary:     Effort: Pulmonary effort is normal. No respiratory distress.     Breath sounds: Normal breath sounds. No wheezing or rales.  Chest:     Chest wall: No tenderness.  Abdominal:     General: Abdomen is flat. There is no distension.     Palpations: Abdomen is soft.     Tenderness: There is no abdominal tenderness. There is no right CVA tenderness, left CVA tenderness or guarding.  Musculoskeletal:        General: No swelling, tenderness or deformity. Normal range of motion.     Cervical back: Normal range of motion and neck supple. No rigidity or tenderness.     Right lower leg: No edema.     Left lower leg: No edema.  Skin:    General: Skin is warm and dry.     Capillary Refill: Capillary refill takes less than 2 seconds.     Coloration: Skin is not pale.  Neurological:     General: No focal deficit present.     Mental Status: He is alert and oriented to person, place, and time.     Cranial Nerves: Cranial nerves are intact. No cranial nerve deficit, dysarthria or facial asymmetry.     Sensory: Sensation is intact. No sensory deficit.     Motor: Motor function is intact. No weakness, tremor or abnormal muscle tone.     Coordination: Coordination is intact. Romberg sign negative. Finger-Nose-Finger Test and Heel to Desloge Test normal.     Gait: Gait is intact.  Gait and tandem walk normal.  Psychiatric:        Attention and Perception: Attention and perception normal.        Mood and Affect: Affect normal. Mood is anxious.        Speech: Speech normal.        Behavior: Behavior is cooperative.     ED Results / Procedures / Treatments   Labs (all labs ordered are listed, but only abnormal results are displayed) Labs Reviewed  I-STAT CHEM 8, ED - Abnormal; Notable for the following components:      Result Value   BUN 4 (*)    All other components within normal limits  CBG MONITORING, ED    EKG None  Radiology No results found.  Procedures Procedures (including critical care time)  Medications Ordered in ED Medications - No data to display  ED Course  I have reviewed the triage vital signs and the nursing notes.  Pertinent labs & imaging results that were available during my care of the patient were reviewed by me and considered in my medical decision making (see chart for details).    MDM Rules/Calculators/A&P                      Patient is a 29 year old male who presents for multiple concerns.  Upon arrival in the ED, vital signs are normal.  Patient appears to be healthy young male.  Currently, he has no symptoms.  He has had intermittent headache pain, described as left-sided and throbbing.  He takes Advil for relief.  Headaches do not last longer than 2 hours at a time.  He has been diagnosed with migraines in the past.  Recently, he has been having intermittent colorful spots in his visual fields, likely migraine auras.  He had some left-sided facial numbness earlier today, that has since resolved.  Given his headaches, visual disturbances, numbness, patient is worried about brain tumor and/or stroke.  Complete neurologic exam was performed without any focal findings.  Blood glucose was normal.  BMP showed no electrolyte disturbances.  At this time, I do not feel that any advanced imaging is indicated.  Patient appears to  suffer from anxiety about his health.  In addition to concerns about his neurologic symptoms, he has irrational worries about bowel obstruction and diabetes.  Patient would benefit from establishing care with a PCP who he can follow-up with about his various symptoms and health concerns.  He reports that he does have a PCP that he can call.  He stated that he will call to set up a follow-up appointment.  Patient was given a prescription for a small amount of Atarax, to be taken as needed for anxiety, if it helps him.  He was discharged home in good condition.  Final Clinical Impression(s) / ED Diagnoses Final diagnoses:  Anxiety about health    Rx / DC Orders ED Discharge Orders         Ordered    hydrOXYzine (ATARAX/VISTARIL) 25 MG tablet  Every 8 hours PRN     04/13/20 2109           Godfrey Pick, MD 04/14/20 Faustino Congress    Noemi Chapel, MD 04/14/20 [phone]

## 2020-04-13 NOTE — Discharge Instructions (Signed)
Schedule a follow-up appointment with your primary care doctor.  There is a prescription attached for Atarax.  This is a medication that can be used, as needed, for symptoms of anxiety.  If it helps you, discuss further with your PCP.

## 2020-04-13 NOTE — ED Notes (Signed)
Patient verbalizes understanding of discharge instructions. Opportunity for questioning and answers were provided. Armband removed by staff, pt discharged from ED ambulatory.   

## 2020-04-18 ENCOUNTER — Telehealth: Payer: Self-pay | Admitting: *Deleted

## 2020-04-18 NOTE — Telephone Encounter (Signed)
Pt mom called regarding pt misplacing printed prescription from ED.  RNCM called in Rx to pharmacy of choice ([facility]).

## 2020-05-17 ENCOUNTER — Other Ambulatory Visit: Payer: Self-pay

## 2020-05-17 ENCOUNTER — Ambulatory Visit: Payer: Self-pay | Attending: Nurse Practitioner | Admitting: Nurse Practitioner

## 2020-06-05 ENCOUNTER — Encounter (HOSPITAL_COMMUNITY): Payer: Self-pay | Admitting: Emergency Medicine

## 2020-06-05 ENCOUNTER — Emergency Department (HOSPITAL_COMMUNITY)
Admission: EM | Admit: 2020-06-05 | Discharge: 2020-06-06 | Disposition: A | Discharge status: Home / Self Care | Payer: Self-pay | Attending: Emergency Medicine | Admitting: Emergency Medicine

## 2020-06-05 ENCOUNTER — Other Ambulatory Visit: Payer: Self-pay

## 2020-06-05 DIAGNOSIS — Z5321 Procedure and treatment not carried out due to patient leaving prior to being seen by health care provider: Secondary | ICD-10-CM | POA: Insufficient documentation

## 2020-06-05 DIAGNOSIS — R109 Unspecified abdominal pain: Secondary | ICD-10-CM | POA: Insufficient documentation

## 2020-06-05 LAB — URINALYSIS, ROUTINE W REFLEX MICROSCOPIC
Bacteria, UA: NONE SEEN
Bilirubin Urine: NEGATIVE
Glucose, UA: NEGATIVE mg/dL
Hgb urine dipstick: NEGATIVE
Ketones, ur: NEGATIVE mg/dL
Leukocytes,Ua: NEGATIVE
Nitrite: NEGATIVE
Protein, ur: 30 mg/dL — AB
Specific Gravity, Urine: 1.028 (ref 1.005–1.030)
pH: 7 (ref 5.0–8.0)

## 2020-06-05 MED ORDER — SODIUM CHLORIDE 0.9% FLUSH: 3.0000 mL | Freq: Once | INTRAVENOUS | Status: DC

## 2020-06-05 NOTE — ED Triage Notes (Signed)
Pt. Stated, Keith Newman had stomach pain for over a month. Ive been seen twice for the same thing.I was here in May.

## 2020-06-05 NOTE — ED Notes (Signed)
Pt leaving AMA. Advised to return if symptoms worsen. 

## 2020-06-06 ENCOUNTER — Telehealth: Payer: Self-pay | Admitting: Licensed Clinical Social Worker

## 2020-06-06 NOTE — Telephone Encounter (Signed)
Call placed to patient regarding [facility] referral. No option to leave voicemail provided.

## 2020-06-19 ENCOUNTER — Ambulatory Visit: Payer: Self-pay | Admitting: Physician Assistant

## 2020-06-19 ENCOUNTER — Telehealth: Payer: Self-pay

## 2020-06-19 NOTE — Telephone Encounter (Signed)
Pt call disconnected. Pt stated he thought today's appt was virtual but did not receive a phone call. Confirmed appt was in person & offer to schedule another appt. Pt call disconnected. Attempted to reach patient several times but call was not answered & no option to leave a message. Please inform pt he can see any provider at first available.

## 2020-06-22 ENCOUNTER — Other Ambulatory Visit: Payer: Self-pay

## 2020-06-22 ENCOUNTER — Encounter (HOSPITAL_COMMUNITY): Payer: Self-pay

## 2020-06-22 ENCOUNTER — Ambulatory Visit (HOSPITAL_COMMUNITY)
Admission: EM | Admit: 2020-06-22 | Discharge: 2020-06-22 | Disposition: A | Discharge status: Home / Self Care | Payer: Self-pay | Attending: Urgent Care | Admitting: Urgent Care

## 2020-06-22 DIAGNOSIS — K921 Melena: Secondary | ICD-10-CM

## 2020-06-22 DIAGNOSIS — R1084 Generalized abdominal pain: Secondary | ICD-10-CM

## 2020-06-22 DIAGNOSIS — R194 Change in bowel habit: Secondary | ICD-10-CM

## 2020-06-22 NOTE — ED Triage Notes (Signed)
Pt presents with abdominal pain since May 2021. States theres not a specific triggers for the pain. Pt worries for clon cancer as runs in his family. States he was seen in other UC, ED and PCP and he was told everything was normal. Not taking any medication for the complaint,

## 2020-06-22 NOTE — ED Provider Notes (Signed)
  [place]   MRN: [id number] DOB: 13-Sep-1991  Subjective:   Keith Newman is a 29 y.o. male presenting for 4-5 month hx of intermittent abdominal pain, 1-2 month hx of new onset constipation, occasional blood in his stools. Denies direct fam hx of colon cancer but is very concerned about this. Has had multiple visits per patient with other providers, states that he has been told nothing is wrong. Has not seen GI. Eats plenty of fiber and hydrates very well.   No current facility-administered medications for this encounter.  Current Outpatient Medications:  .  albuterol (VENTOLIN HFA) 108 (90 Base) MCG/ACT inhaler, Inhale 2 puffs into the lungs every 6 (six) hours as needed for wheezing or shortness of breath., Disp: 18 g, Rfl: 11 .  hydrOXYzine (ATARAX/VISTARIL) 25 MG tablet, Take 1 tablet (25 mg total) by mouth every 8 (eight) hours as needed for anxiety., Disp: 7 tablet, Rfl: 0   No Known Allergies  Past Medical History:  Diagnosis Date  . Asthma      Past Surgical History:  Procedure Laterality Date  . no past surgery      Family History  Problem Relation Age of Onset  . Diabetes Mother   . Hypertension Mother   . Prostate cancer Paternal Uncle   . Leukemia Maternal Grandmother   . Heart attack Neg Hx   . Stroke Neg Hx     Social History   Tobacco Use  . Smoking status: Never Smoker  . Smokeless tobacco: Never Used  Vaping Use  . Vaping Use: Never used  Substance Use Topics  . Alcohol use: Not Currently  . Drug use: Not Currently    ROS   Objective:   Vitals: BP 127/66 (BP Location: Right Arm)   Pulse 87   Temp 98.7 F (37.1 C) (Oral)   Resp 17   SpO2 97%   Physical Exam Constitutional:      Appearance: He is well-developed.  Eyes:     General: No scleral icterus. Cardiovascular:     Rate and Rhythm: Normal rate and regular rhythm.     Heart sounds: No murmur heard.  No friction rub. No gallop.   Pulmonary:     Effort: No  respiratory distress.     Breath sounds: No wheezing or rales.  Abdominal:     General: Bowel sounds are normal. There is no distension.     Palpations: Abdomen is soft. There is no mass.     Tenderness: There is no abdominal tenderness. There is no guarding or rebound.  Skin:    General: Skin is warm and dry.  Neurological:     Mental Status: He is alert and oriented to person, place, and time.     Assessment and Plan :   PDMP not reviewed this encounter.  1. Generalized abdominal pain   2. Change in bowel habits   3. Bloody stools     Patient referred to GI for consult for change in bowel habits, new onset belly pain and bloody stools. Use supportive care in the meantime, maintain healthy diet. Counseled patient on potential for adverse effects with medications prescribed/recommended today, ER and return-to-clinic precautions discussed, patient verbalized understanding.    Jaynee Eagles, PA-C 06/22/20 1751

## 2020-06-25 ENCOUNTER — Encounter: Payer: Self-pay | Admitting: Gastroenterology

## 2020-08-23 ENCOUNTER — Encounter: Payer: Self-pay | Admitting: Gastroenterology

## 2020-08-23 ENCOUNTER — Ambulatory Visit (INDEPENDENT_AMBULATORY_CARE_PROVIDER_SITE_OTHER): Payer: Self-pay | Admitting: Gastroenterology

## 2020-08-23 VITALS — BP 124/60 | HR 88 | Ht 75.0 in | Wt 162.0 lb

## 2020-08-23 DIAGNOSIS — R194 Change in bowel habit: Secondary | ICD-10-CM

## 2020-08-23 DIAGNOSIS — R109 Unspecified abdominal pain: Secondary | ICD-10-CM

## 2020-08-23 DIAGNOSIS — K625 Hemorrhage of anus and rectum: Secondary | ICD-10-CM

## 2020-08-23 MED ORDER — SUTAB 1479-225-188 MG PO TABS: 1.0000 | ORAL_TABLET | ORAL | 0 refills | Status: DC

## 2020-08-23 NOTE — Progress Notes (Signed)
HPI :  29 y/o male with a history of asthma and anxiety, referred by Geryl Rankins NP for rectal bleeding and altered bowel habits.  He states for the past year or so he has had some GI symptoms bothering him. Rectal bleeding started February 2021. Since that time he has had intermittent red blood in the stools. Difficult to predict when this will happen, occurs randomly, no clear triggers. Can have multiple episodes over a few days and then may not have it for a while, several weeks, comes and goes. Denies any perianal pain with this. He has some mild constipation at baseline, but states stool form has changed, can have regular BM and then some thin caliber stools.  He denies any diarrhea, no urgency to his bowels.  He also has developed some intermittent abdominal pain in his right and left mid abdomen.  States this is intermittent, often comes and goes.  Sometimes feels better with gas but not reliably relieved with a bowel movement.  Denies any triggers for this.  He thinks his weight is stable.  No nausea or vomiting.  He is eating well.  No reflux symptoms.  He is not using any NSAIDs.  He has mild asthma and states that fairly well controlled with his inhaler.  He has no known family history of IBD.  His cousin had colon cancer diagnosed at age 55s, no other family members with colon cancer.  No prior colonoscopy exams.  He does state he is quite concerned about the possibility for a polyp or colon cancer, he has some anxiety about what is causing the symptoms. He has been to the ED a few times about his symptoms.   Past Medical History:  Diagnosis Date  . Asthma      Past Surgical History:  Procedure Laterality Date  . no past surgery     Family History  Problem Relation Age of Onset  . Diabetes Mother   . Hypertension Mother   . Prostate cancer Paternal Uncle   . Leukemia Maternal Grandmother   . Heart attack Neg Hx   . Stroke Neg Hx    Social History   Tobacco Use  .  Smoking status: Never Smoker  . Smokeless tobacco: Never Used  Vaping Use  . Vaping Use: Never used  Substance Use Topics  . Alcohol use: Not Currently  . Drug use: Not Currently   Current Outpatient Medications  Medication Sig Dispense Refill  . albuterol (VENTOLIN HFA) 108 (90 Base) MCG/ACT inhaler Inhale 2 puffs into the lungs every 6 (six) hours as needed for wheezing or shortness of breath. 18 g 11  . hydrOXYzine (ATARAX/VISTARIL) 25 MG tablet Take 1 tablet (25 mg total) by mouth every 8 (eight) hours as needed for anxiety. 7 tablet 0   No current facility-administered medications for this visit.   No Known Allergies   Review of Systems: All systems reviewed and negative except where noted in HPI.   Lab Results  Component Value Date   WBC 6.3 02/21/2020   HGB 15.0 04/13/2020   HCT 44.0 04/13/2020   MCV 94.7 02/21/2020   PLT 222 02/21/2020    Lab Results  Component Value Date   CREATININE 0.90 04/13/2020   BUN 4 (L) 04/13/2020   NA 140 04/13/2020   K 3.5 04/13/2020   CL 102 04/13/2020   CO2 24 02/21/2020    No results found for: ALT, AST, GGT, ALKPHOS, BILITOT   Physical Exam: Ht 6\' 3"  (  1.905 m)   Wt 162 lb (73.5 kg)   BMI 20.25 kg/m  Constitutional: Pleasant,well-developed, male in no acute distress. HEENT: Normocephalic and atraumatic. Conjunctivae are normal. No scleral icterus. Neck supple.  Cardiovascular: Normal rate, regular rhythm.  Pulmonary/chest: Effort normal and breath sounds normal.  Abdominal: Soft, nondistended, nontender.  There are no masses palpable. DRE deferred as below Extremities: no edema Lymphadenopathy: No cervical adenopathy noted. Neurological: Alert and oriented to person place and time. Skin: Skin is warm and dry. No rashes noted. Psychiatric: Normal mood and affect. Behavior is normal.   ASSESSMENT AND PLAN: 29 year old male here for new patient assessment of the following:  Rectal bleeding / altered bowel habits /  abdominal pain - patient with several months of intermittent rectal bleeding, fluctuations in stool form and intermittent abdominal discomfort.  I discussed the differential diagnosis for his symptoms.  He is rather anxious about this and quite concerned about underlying malignancy.  I reassured him that the risk of that in his age group is extremely low, but we also discussed other etiologies.  Hemorrhoid/anorectal bleeding quite possible however IBD and other etiologies also possible.  I discussed options for further evaluating this.  Given the chronicity of his symptoms, now ongoing for several months, performing a colonoscopy would really provide him a lot of reassurance and peace of mind as given his intermittent symptoms, this may be likely to continue periodically even if related to hemorrhoids and that is the underlying cause.  I discussed what colonoscopy is with him, risk benefits of that and anesthesia and he strongly wanted to proceed.  As such, we deferred DRE until time of colonoscopy.  In the interim recommend he try Citrucel to provide some regularity to his bowel habits and see if that helps.  Further recommendations pending results of the colonoscopy.  [place] Cellar, MD [facility]  CC: Gildardo Pounds, NP

## 2020-08-23 NOTE — Patient Instructions (Signed)
We have sent the following medications to your pharmacy for you to pick up at your convenience: sutab   You have been scheduled for a colonoscopy. Please follow written instructions given to you at your visit today.  Please pick up your prep supplies at the pharmacy within the next 1-3 days. If you use inhalers (even only as needed), please bring them with you on the day of your procedure.    Use Citrucel daily.   I appreciate the opportunity to care for you. [place] Cellar, MD

## 2020-08-26 ENCOUNTER — Telehealth: Payer: Self-pay | Admitting: Gastroenterology

## 2020-08-26 ENCOUNTER — Other Ambulatory Visit: Payer: Self-pay

## 2020-08-26 MED ORDER — SUTAB 1479-225-188 MG PO TABS: 1.0000 | ORAL_TABLET | ORAL | 0 refills | Status: DC

## 2020-08-26 NOTE — Telephone Encounter (Signed)
[facility]

## 2020-08-26 NOTE — Telephone Encounter (Signed)
Patient's mother called requesting if we can send the medication to [facility] [phone] instead

## 2020-08-26 NOTE — Progress Notes (Signed)
Script for [facility] sent to [facility].

## 2020-08-27 MED FILL — SUTAB 1479-225-188 MG TABS: 1479-225-18 | 2 days supply | Qty: 24 | Fill #0

## 2020-09-17 ENCOUNTER — Ambulatory Visit (INDEPENDENT_AMBULATORY_CARE_PROVIDER_SITE_OTHER): Payer: Self-pay

## 2020-09-17 ENCOUNTER — Other Ambulatory Visit: Payer: Self-pay | Admitting: Gastroenterology

## 2020-09-17 ENCOUNTER — Other Ambulatory Visit: Payer: Self-pay

## 2020-09-17 DIAGNOSIS — Z1159 Encounter for screening for other viral diseases: Secondary | ICD-10-CM

## 2020-09-18 LAB — SARS CORONAVIRUS 2 (TAT 6-24 HRS): SARS Coronavirus 2: NEGATIVE

## 2020-09-19 ENCOUNTER — Ambulatory Visit (AMBULATORY_SURGERY_CENTER): Payer: Self-pay | Admitting: Gastroenterology

## 2020-09-19 ENCOUNTER — Encounter: Payer: Self-pay | Admitting: Gastroenterology

## 2020-09-19 ENCOUNTER — Other Ambulatory Visit: Payer: Self-pay

## 2020-09-19 VITALS — BP 116/73 | HR 82 | Temp 97.6°F | Resp 17 | Ht 75.0 in | Wt 162.0 lb

## 2020-09-19 DIAGNOSIS — K6389 Other specified diseases of intestine: Secondary | ICD-10-CM

## 2020-09-19 DIAGNOSIS — D123 Benign neoplasm of transverse colon: Secondary | ICD-10-CM

## 2020-09-19 DIAGNOSIS — K625 Hemorrhage of anus and rectum: Secondary | ICD-10-CM

## 2020-09-19 DIAGNOSIS — R194 Change in bowel habit: Secondary | ICD-10-CM

## 2020-09-19 DIAGNOSIS — K635 Polyp of colon: Secondary | ICD-10-CM

## 2020-09-19 MED ORDER — SODIUM CHLORIDE 0.9 % IV SOLN: 500.0000 mL | INTRAVENOUS | Status: DC

## 2020-09-19 NOTE — Patient Instructions (Signed)
Please read handouts provided. Continue present medications. Await pathology results. Continue daily fiber supplementation ( Citrucel ).      YOU HAD AN ENDOSCOPIC PROCEDURE TODAY AT [facility]:   Refer to the procedure report that was given to you for any specific questions about what was found during the examination.  If the procedure report does not answer your questions, please call your gastroenterologist to clarify.  If you requested that your care partner not be given the details of your procedure findings, then the procedure report has been included in a sealed envelope for you to review at your convenience later.  YOU SHOULD EXPECT: Some feelings of bloating in the abdomen. Passage of more gas than usual.  Walking can help get rid of the air that was put into your GI tract during the procedure and reduce the bloating. If you had a lower endoscopy (such as a colonoscopy or flexible sigmoidoscopy) you may notice spotting of blood in your stool or on the toilet paper. If you underwent a bowel prep for your procedure, you may not have a normal bowel movement for a few days.  Please Note:  You might notice some irritation and congestion in your nose or some drainage.  This is from the oxygen used during your procedure.  There is no need for concern and it should clear up in a day or so.  SYMPTOMS TO REPORT IMMEDIATELY:   Following lower endoscopy (colonoscopy or flexible sigmoidoscopy):  Excessive amounts of blood in the stool  Significant tenderness or worsening of abdominal pains  Swelling of the abdomen that is new, acute  Fever of 100F or higher    For urgent or emergent issues, a gastroenterologist can be reached at any hour by calling [phone]. Do not use MyChart messaging for urgent concerns.    DIET:  We do recommend a small meal at first, but then you may proceed to your regular diet.  Drink plenty of fluids but you should avoid alcoholic beverages  for 24 hours.  ACTIVITY:  You should plan to take it easy for the rest of today and you should NOT DRIVE or use heavy machinery until tomorrow (because of the sedation medicines used during the test).    FOLLOW UP: Our staff will call the number listed on your records 48-72 hours following your procedure to check on you and address any questions or concerns that you may have regarding the information given to you following your procedure. If we do not reach you, we will leave a message.  We will attempt to reach you two times.  During this call, we will ask if you have developed any symptoms of COVID 19. If you develop any symptoms (ie: fever, flu-like symptoms, shortness of breath, cough etc.) before then, please call [phone].  If you test positive for Covid 19 in the 2 weeks post procedure, please call and report this information to [place].    If any biopsies were taken you will be contacted by phone or by letter within the next 1-3 weeks.  Please call us at [phone] if you have not heard about the biopsies in 3 weeks.    SIGNATURES/CONFIDENTIALITY: You and/or your care partner have signed paperwork which will be entered into your electronic medical record.  These signatures attest to the fact that that the information above on your After Visit Summary has been reviewed and is understood.  Full responsibility of the confidentiality of this discharge information lies  with you and/or your care-partner.

## 2020-09-19 NOTE — Progress Notes (Signed)
Report to PACU, RN, vss, BBS= Clear.  

## 2020-09-19 NOTE — Progress Notes (Signed)
SH -     V/s

## 2020-09-19 NOTE — Op Note (Signed)
[name] Patient Name: Keith Newman Procedure Date: 09/19/2020 7:28 AM MRN: [id number] Endoscopist: Remo Lipps P. Havery Moros , MD Age: 29 Referring MD:  Date of Birth: 07-Jan-1991 Gender: Male Account #: [id number] Procedure:                Colonoscopy Indications:              Rectal bleeding, Change in bowel habits - improved                            with fiber supplementation Medicines:                Monitored Anesthesia Care Procedure:                Pre-Anesthesia Assessment:                           - Prior to the procedure, a History and Physical                            was performed, and patient medications and                            allergies were reviewed. The patient's tolerance of                            previous anesthesia was also reviewed. The risks                            and benefits of the procedure and the sedation                            options and risks were discussed with the patient.                            All questions were answered, and informed consent                            was obtained. Prior Anticoagulants: The patient has                            taken no previous anticoagulant or antiplatelet                            agents. ASA Grade Assessment: II - A patient with                            mild systemic disease. After reviewing the risks                            and benefits, the patient was deemed in                            satisfactory condition to undergo the procedure.  After obtaining informed consent, the colonoscope                            was passed under direct vision. Throughout the                            procedure, the patient's blood pressure, pulse, and                            oxygen saturations were monitored continuously. The                            Colonoscope was introduced through the anus and                            advanced to the the  terminal ileum, with                            identification of the appendiceal orifice and IC                            valve. The colonoscopy was performed without                            difficulty. The patient tolerated the procedure                            well. The quality of the bowel preparation was                            good. The terminal ileum, ileocecal valve,                            appendiceal orifice, and rectum were photographed. Scope In: 7:31:56 AM Scope Out: 1:82:99 AM Scope Withdrawal Time: 0 hours 18 minutes 1 second  Total Procedure Duration: 0 hours 23 minutes 58 seconds  Findings:                 The perianal and digital rectal examinations were                            normal.                           The terminal ileum was nodular with suspected                            prominent benign lymph tissue. Suspect normal                            variant but biopsies were taken with a cold forceps                            for histology.  A 4 mm polyp was found in the hepatic flexure. The                            polyp was sessile. The polyp was removed with a                            cold snare. Resection and retrieval were complete.                           The colon was quite tortuous.                           Internal hemorrhoids were found during                            retroflexion. The hemorrhoids were small.                           The exam was otherwise without abnormality. Complications:            No immediate complications. Estimated blood loss:                            Minimal. Estimated Blood Loss:     Estimated blood loss was minimal. Impression:               - Suspect benign lymph tissue in the ileum, normal                            varient. Biopsied.                           - One 4 mm polyp at the hepatic flexure, removed                            with a cold snare. Resected and  retrieved.                           - Tortuous colon.                           - Internal hemorrhoids.                           - The examination was otherwise normal. Recommendation:           - Patient has a contact number available for                            emergencies. The signs and symptoms of potential                            delayed complications were discussed with the                            patient. Return to normal activities tomorrow.  Written discharge instructions were provided to the                            patient.                           - Resume previous diet.                           - Continue present medications.                           - Await pathology results.                           - Continue daily fiber supplementation (Citrucel) Remo Lipps P. Lamarr Feenstra, MD 09/19/2020 8:04:47 AM This report has been signed electronically.

## 2020-09-23 ENCOUNTER — Telehealth: Payer: Self-pay | Admitting: *Deleted

## 2020-09-23 ENCOUNTER — Telehealth: Payer: Self-pay

## 2020-09-23 NOTE — Telephone Encounter (Signed)
  Follow up Call-  Call back number 09/19/2020  Post procedure Call Back phone  # he doesnt know it    Called [phone] to check in with the patient after having his procedure with [place].  NALM

## 2020-09-23 NOTE — Telephone Encounter (Signed)
No answer for post procedure call back. Left message for patient to call with questions or concerns. 

## 2020-09-25 ENCOUNTER — Encounter: Payer: Self-pay | Admitting: Gastroenterology

## 2020-10-21 ENCOUNTER — Other Ambulatory Visit: Payer: Self-pay

## 2020-10-21 ENCOUNTER — Encounter: Payer: Self-pay | Admitting: Family

## 2020-10-21 ENCOUNTER — Ambulatory Visit: Payer: Medicaid Other | Attending: Family | Admitting: Family

## 2020-10-21 VITALS — BP 132/80 | HR 81 | Temp 96.4°F | Resp 18 | Ht 75.0 in | Wt 178.0 lb

## 2020-10-21 DIAGNOSIS — H9313 Tinnitus, bilateral: Secondary | ICD-10-CM

## 2020-10-21 NOTE — Progress Notes (Signed)
Patient ID: Keith Newman, male    DOB: 1991-06-25  MRN: [id number]  CC: Ringing in Ears  Subjective: [name] is a 29 y.o. male with history of migraines who presents for ringing in ears.  1. RINGING IN EARS: Duration: weeks suddenly  Both ears  Description of tinnitus: low-pitch, sounds like putting a seashell to ear Tinnitus duration: seconds Episode frequency: hasnt happened in a while, just random Severity: mild Aggravating factors: loud sounds Alleviating factors: walk it off a little bit Head injury: no Chronic exposure to loud noises: fan in the home Exposure to ototoxic medications: no Vertigo:no Hearing loss: no Aural fullness: no Headache:denies Pulsatile: Yes Unsteady gait: no Postural instability: no Diplopia, dysarthria, dysphagia or weakness: no Anxietydepression: both, afraid to die before he can accomplish his goals, depression r/t health concerns, prescribed anxiety medication and does not want to take it because he is concerned something worse may happen. Denies thoughts of self-harm and suicidal ideation.  Patient Active Problem List   Diagnosis Date Noted  . Migraines 02/29/2020     Current Outpatient Medications on File Prior to Visit  Medication Sig Dispense Refill  . albuterol (VENTOLIN HFA) 108 (90 Base) MCG/ACT inhaler Inhale 2 puffs into the lungs every 6 (six) hours as needed for wheezing or shortness of breath. 18 g 11  . hydrOXYzine (ATARAX/VISTARIL) 25 MG tablet Take 1 tablet (25 mg total) by mouth every 8 (eight) hours as needed for anxiety. 7 tablet 0  . Sodium Sulfate-Mag Sulfate-KCl (SUTAB) [phone] MG TABS Take 1 kit by mouth as directed. MANUFACTURER CODES!! BIN: [id number] PCN: CN GROUP: [id number] MEMBER ID: [id number];MEQ AS CASH;NO PRIOR AUTHORIZATION 24 tablet 0   No current facility-administered medications on file prior to visit.    No Known Allergies  Social History   Socioeconomic History  . Marital status:  Single    Spouse name: Not on file  . Number of children: Not on file  . Years of education: Not on file  . Highest education level: Not on file  Occupational History  . Not on file  Tobacco Use  . Smoking status: Never Smoker  . Smokeless tobacco: Never Used  Vaping Use  . Vaping Use: Never used  Substance and Sexual Activity  . Alcohol use: Not Currently  . Drug use: Not Currently  . Sexual activity: Not Currently  Other Topics Concern  . Not on file  Social History Narrative  . Not on file   Social Determinants of Health   Financial Resource Strain:   . Difficulty of Paying Living Expenses: Not on file  Food Insecurity:   . Worried About Charity fundraiser in the Last Year: Not on file  . Ran Out of Food in the Last Year: Not on file  Transportation Needs:   . Lack of Transportation (Medical): Not on file  . Lack of Transportation (Non-Medical): Not on file  Physical Activity:   . Days of Exercise per Week: Not on file  . Minutes of Exercise per Session: Not on file  Stress:   . Feeling of Stress : Not on file  Social Connections:   . Frequency of Communication with Friends and Family: Not on file  . Frequency of Social Gatherings with Friends and Family: Not on file  . Attends Religious Services: Not on file  . Active Member of Clubs or Organizations: Not on file  . Attends Archivist Meetings: Not on file  . Marital Status: Not on  file  Intimate Partner Violence:   . Fear of Current or Ex-Partner: Not on file  . Emotionally Abused: Not on file  . Physically Abused: Not on file  . Sexually Abused: Not on file    Family History  Problem Relation Age of Onset  . Diabetes Mother   . Hypertension Mother   . Prostate cancer Paternal Uncle   . Leukemia Maternal Grandmother   . Colon cancer Other   . Heart attack Neg Hx   . Stroke Neg Hx     Past Surgical History:  Procedure Laterality Date  . no past surgery      ROS: Review of  Systems Negative except as stated above  PHYSICAL EXAM: BP 132/80 (BP Location: Left Arm, Patient Position: Sitting, Cuff Size: Normal)   Pulse 81   Temp (!) 96.4 F (35.8 C) (Temporal)   Resp 18   Ht 6' 3"  (1.905 m)   Wt 178 lb (80.7 kg)   SpO2 98%   BMI 22.25 kg/m   Physical Exam Constitutional:      Appearance: Normal appearance.  HENT:     Head: Normocephalic.     Right Ear: Tympanic membrane normal.     Left Ear: Tympanic membrane normal.     Nose: Nose normal.     Mouth/Throat:     Mouth: Mucous membranes are moist.     Pharynx: Oropharynx is clear.  Eyes:     Extraocular Movements: Extraocular movements intact.     Conjunctiva/sclera: Conjunctivae normal.     Pupils: Pupils are equal, round, and reactive to light.  Cardiovascular:     Rate and Rhythm: Normal rate and regular rhythm.     Pulses: Normal pulses.     Heart sounds: Normal heart sounds.  Pulmonary:     Effort: Pulmonary effort is normal.     Breath sounds: Normal breath sounds.  Musculoskeletal:     Cervical back: Normal range of motion and neck supple.  Neurological:     General: No focal deficit present.     Mental Status: He is alert and oriented to person, place, and time.  Psychiatric:        Mood and Affect: Mood normal.        Behavior: Behavior normal.    ASSESSMENT AND PLAN: 1. Tinnitus of both ears: - Reports happens randomly. Low-pitched sound.  - Referral to ENT for further evaluation and management. - Follow-up with primary provider as scheduled. - Ambulatory referral to ENT  Patient was given the opportunity to ask questions.  Patient verbalized understanding of the plan and was able to repeat key elements of the plan. Patient was given clear instructions to go to Emergency Department or return to medical center if symptoms don't improve, worsen, or new problems develop.The patient verbalized understanding.    Orders Placed This Encounter  Procedures  . Ambulatory referral to  ENT     Requested Prescriptions    No prescriptions requested or ordered in this encounter    No follow-ups on file.  Camillia Herter, NP

## 2020-10-21 NOTE — Progress Notes (Signed)
Patient presents with intermittent Left ear tinnitus for the past week. Patient complains of a ringing that wakes him out of his sleep followed by a throbbing. Patient has not experienced the concern in the past 2 days. Patient denies ever noticing this concern prior to the past week.

## 2020-10-21 NOTE — Patient Instructions (Addendum)
Referral to ENT.   Apply for [facility] Financial Discount/orange card.   Tinnitus Tinnitus refers to hearing a sound when there is no actual source for that sound. This is often described as ringing in the ears. However, people with this condition may hear a variety of noises, in one ear or in both ears. The sounds of tinnitus can be soft, loud, or somewhere in between. Tinnitus can last for a few seconds or can be constant for days. It may go away without treatment and come back at various times. When tinnitus is constant or happens often, it can lead to other problems, such as trouble sleeping and trouble concentrating. Almost everyone experiences tinnitus at some point. Tinnitus that is long-lasting (chronic) or comes back often (recurs) may require medical attention. What are the causes? The cause of tinnitus is often not known. In some cases, it can result from:  Exposure to loud noises from machinery, music, or other sources.  An object (foreign body) stuck in the ear.  Earwax buildup.  Drinking alcohol or caffeine.  Taking certain medicines.  Age-related hearing loss. It may also be caused by medical conditions such as:  Ear or sinus infections.  High blood pressure.  Heart diseases.  Anemia.  Allergies.  Meniere's disease.  Thyroid problems.  Tumors.  A weak, bulging blood vessel (aneurysm) near the ear. What are the signs or symptoms? The main symptom of tinnitus is hearing a sound when there is no source for that sound. It may sound like:  Buzzing.  Roaring.  Ringing.  Blowing air.  Hissing.  Whistling.  Sizzling.  Humming.  Running water.  A musical note.  Tapping. Symptoms may affect only one ear (unilateral) or both ears (bilateral). How is this diagnosed? Tinnitus is diagnosed based on your symptoms, your medical history, and a physical exam. Your health care provider may do a thorough hearing test (audiologic exam) if your  tinnitus:  Is unilateral.  Causes hearing difficulties.  Lasts 6 months or longer. You may work with a health care provider who specializes in hearing disorders (audiologist). You may be asked questions about your symptoms and how they affect your daily life. You may have other tests done, such as:  CT scan.  MRI.  An imaging test of how blood flows through your blood vessels (angiogram). How is this treated? Treating an underlying medical condition can sometimes make tinnitus go away. If your tinnitus continues, other treatments may include:  Medicines.  Therapy and counseling to help you manage the stress of living with tinnitus.  Sound generators to mask the tinnitus. These include: ? Tabletop sound machines that play relaxing sounds to help you fall asleep. ? Wearable devices that fit in your ear and play sounds or music. ? Acoustic neural stimulation. This involves using headphones to listen to music that contains an auditory signal. Over time, listening to this signal may change some pathways in your brain and make you less sensitive to tinnitus. This treatment is used for very severe cases when no other treatment is working.  Using hearing aids or cochlear implants if your tinnitus is related to hearing loss. Hearing aids are worn in the outer ear. Cochlear implants are surgically placed in the inner ear. Follow these instructions at home: Managing symptoms      When possible, avoid being in loud places and being exposed to loud sounds.  Wear hearing protection, such as earplugs, when you are exposed to loud noises.  Use a  white noise machine, a humidifier, or other devices to mask the sound of tinnitus.  Practice techniques for reducing stress, such as meditation, yoga, or deep breathing. Work with your health care provider if you need help with managing stress.  Sleep with your head slightly raised. This may reduce the impact of tinnitus. General instructions  Do  not use stimulants, such as nicotine, alcohol, or caffeine. Talk with your health care provider about other stimulants to avoid. Stimulants are substances that can make you feel alert and attentive by increasing certain activities in the body (such as heart rate and blood pressure). These substances may make tinnitus worse.  Take over-the-counter and prescription medicines only as told by your health care provider.  Try to get plenty of sleep each night.  Keep all follow-up visits as told by your health care provider. This is important. Contact a health care provider if:  Your tinnitus continues for 3 weeks or longer without stopping.  You develop sudden hearing loss.  Your symptoms get worse or do not get better with home care.  You feel you are not able to manage the stress of living with tinnitus. Get help right away if:  You develop tinnitus after a head injury.  You have tinnitus along with any of the following: ? Dizziness. ? Loss of balance. ? Nausea and vomiting. ? Sudden, severe headache. These symptoms may represent a serious problem that is an emergency. Do not wait to see if the symptoms will go away. Get medical help right away. Call your local emergency services (911 in the [place] Do not drive yourself to the hospital. Summary  Tinnitus refers to hearing a sound when there is no actual source for that sound. This is often described as ringing in the ears.  Symptoms may affect only one ear (unilateral) or both ears (bilateral).  Use a white noise machine, a humidifier, or other devices to mask the sound of tinnitus.  Do not use stimulants, such as nicotine, alcohol, or caffeine. Talk with your health care provider about other stimulants to avoid. These substances may make tinnitus worse. This information is not intended to replace advice given to you by your health care provider. Make sure you discuss any questions you have with your health care provider. Document  Revised: 06/07/2019 Document Reviewed: 09/02/2017 Elsevier Patient Education  2020 [employer].

## 2020-12-25 ENCOUNTER — Ambulatory Visit (HOSPITAL_COMMUNITY)
Admission: EM | Admit: 2020-12-25 | Discharge: 2020-12-25 | Disposition: A | Discharge status: Home / Self Care | Payer: Self-pay | Attending: Family Medicine | Admitting: Family Medicine

## 2020-12-25 ENCOUNTER — Other Ambulatory Visit (HOSPITAL_COMMUNITY): Payer: Self-pay | Admitting: Emergency Medicine

## 2020-12-25 ENCOUNTER — Other Ambulatory Visit: Payer: Self-pay

## 2020-12-25 ENCOUNTER — Encounter (HOSPITAL_COMMUNITY): Payer: Self-pay | Admitting: Emergency Medicine

## 2020-12-25 DIAGNOSIS — R319 Hematuria, unspecified: Secondary | ICD-10-CM | POA: Insufficient documentation

## 2020-12-25 DIAGNOSIS — N390 Urinary tract infection, site not specified: Secondary | ICD-10-CM | POA: Insufficient documentation

## 2020-12-25 LAB — POCT URINALYSIS DIPSTICK, ED / UC
Glucose, UA: NEGATIVE mg/dL
Nitrite: NEGATIVE
Protein, ur: >=300 mg/dL — AB
Specific Gravity, Urine: >=1.03 (ref 1.005–1.030)
Urobilinogen, UA: >=8 mg/dL (ref 0.0–1.0)
pH: 6 (ref 5.0–8.0)

## 2020-12-25 MED ORDER — CEPHALEXIN 500 MG PO CAPS: 500.0000 mg | ORAL_CAPSULE | Freq: Two times a day (BID) | ORAL | 0 refills | Status: AC

## 2020-12-25 MED FILL — CEPHALEXIN 500 MG CAPSULE: 500 | 5 days supply | Qty: 10 | Fill #0

## 2020-12-25 NOTE — ED Provider Notes (Signed)
[employer] [facility]    CSN: [id number] Arrival date & time: 12/25/20  1320      History   Chief Complaint Chief Complaint  Patient presents with  . Hematuria  . Dysuria    HPI Keith Newman is a 30 y.o. male.   Patient presents with 2-day history of dysuria, hematuria, urinary frequency, suprapubic pressure, back pain.  He denies fever, chills, penile discharge, testicular pain, rash, lesions, or other symptoms.  No treatments attempted at home.  Patient denies sexual activity x several years.  His medical history includes asthma and depression.  The history is provided by the patient and medical records.    Past Medical History:  Diagnosis Date  . Asthma   . Depression     Patient Active Problem List   Diagnosis Date Noted  . Migraines 02/29/2020    Past Surgical History:  Procedure Laterality Date  . no past surgery         Home Medications    Prior to Admission medications   Medication Sig Start Date End Date Taking? Authorizing Provider  cephALEXin (KEFLEX) 500 MG capsule Take 1 capsule (500 mg total) by mouth 2 (two) times daily for 5 days. 12/25/20 12/30/20 Yes Sharion Balloon, NP  albuterol (VENTOLIN HFA) 108 (90 Base) MCG/ACT inhaler Inhale 2 puffs into the lungs every 6 (six) hours as needed for wheezing or shortness of breath. 02/29/20   Fulp, Cammie, MD  hydrOXYzine (ATARAX/VISTARIL) 25 MG tablet Take 1 tablet (25 mg total) by mouth every 8 (eight) hours as needed for anxiety. 04/13/20   Godfrey Pick, MD  Sodium Sulfate-Mag Sulfate-KCl (SUTAB) [phone] MG TABS Take 1 kit by mouth as directed. MANUFACTURER CODES!! Kara Dies: [id number] PCN: CN GROUP: [id number] MEMBER ID: [id number];EHM AS CASH;NO PRIOR AUTHORIZATION 08/26/20   Armbruster, Carlota Raspberry, MD    Family History Family History  Problem Relation Age of Onset  . Diabetes Mother   . Hypertension Mother   . Prostate cancer Paternal Uncle   . Leukemia Maternal Grandmother   . Colon cancer Other   . Heart  attack Neg Hx   . Stroke Neg Hx     Social History Social History   Tobacco Use  . Smoking status: Never Smoker  . Smokeless tobacco: Never Used  Vaping Use  . Vaping Use: Never used  Substance Use Topics  . Alcohol use: Not Currently  . Drug use: Not Currently     Allergies   Patient has no known allergies.   Review of Systems Review of Systems  Constitutional: Negative for chills and fever.  HENT: Negative for ear pain and sore throat.   Eyes: Negative for pain and visual disturbance.  Respiratory: Negative for cough and shortness of breath.   Cardiovascular: Negative for chest pain and palpitations.  Gastrointestinal: Positive for abdominal pain. Negative for vomiting.  Genitourinary: Positive for dysuria, frequency and hematuria. Negative for penile discharge and testicular pain.  Musculoskeletal: Positive for back pain. Negative for arthralgias.  Skin: Negative for color change and rash.  Neurological: Negative for seizures and syncope.  All other systems reviewed and are negative.    Physical Exam Triage Vital Signs ED Triage Vitals  Enc Vitals Group     BP      Pulse      Resp      Temp      Temp src      SpO2      Weight      Height  Head Circumference      Peak Flow      Pain Score      Pain Loc      Pain Edu?      Excl. in [place]?    No data found.  Updated Vital Signs BP 128/71 (BP Location: Left Arm)   Pulse (!) 103   Temp 99.7 F (37.6 C) (Oral)   Resp 16   Ht _0  (1.905 m)   Wt 175 lb (79.4 kg)   SpO2 98%   BMI 21.87 kg/m   Visual Acuity Right Eye Distance:   Left Eye Distance:   Bilateral Distance:    Right Eye Near:   Left Eye Near:    Bilateral Near:     Physical Exam Vitals and nursing note reviewed.  Constitutional:      General: He is not in acute distress.    Appearance: He is well-developed and well-nourished. He is not ill-appearing.  HENT:     Head: Normocephalic and atraumatic.     Mouth/Throat:      Mouth: Mucous membranes are moist.  Eyes:     Conjunctiva/sclera: Conjunctivae normal.  Cardiovascular:     Rate and Rhythm: Normal rate and regular rhythm.     Heart sounds: Normal heart sounds.  Pulmonary:     Effort: Pulmonary effort is normal. No respiratory distress.     Breath sounds: Normal breath sounds.  Abdominal:     General: Bowel sounds are normal.     Palpations: Abdomen is soft.     Tenderness: There is no abdominal tenderness. There is no right CVA tenderness, left CVA tenderness, guarding or rebound.  Musculoskeletal:        General: No edema.     Cervical back: Neck supple.  Skin:    General: Skin is warm and dry.  Neurological:     General: No focal deficit present.     Mental Status: He is alert and oriented to person, place, and time.     Gait: Gait normal.  Psychiatric:        Mood and Affect: Mood and affect and mood normal.        Behavior: Behavior normal.      UC Treatments / Results  Labs (all labs ordered are listed, but only abnormal results are displayed) Labs Reviewed  POCT URINALYSIS DIPSTICK, ED / UC - Abnormal; Notable for the following components:      Result Value   Bilirubin Urine SMALL (*)    Ketones, ur TRACE (*)    Hgb urine dipstick LARGE (*)    Protein, ur >=300 (*)    Leukocytes,Ua SMALL (*)    All other components within normal limits  URINE CULTURE    EKG   Radiology No results found.  Procedures Procedures (including critical care time)  Medications Ordered in UC Medications - No data to display  Initial Impression / Assessment and Plan / UC Course  I have reviewed the triage vital signs and the nursing notes.  Pertinent labs & imaging results that were available during my care of the patient were reviewed by me and considered in my medical decision making (see chart for details).   UTI.  Urine culture pending.  Treating with Keflex.  Patient declines STD testing.  Discussed that we will call him if the urine  culture shows the need to change or discontinue his antibiotic.  Instructed him to follow-up with his PCP if his symptoms are not improving.  He agrees to plan of care.   Final Clinical Impressions(s) / UC Diagnoses   Final diagnoses:  Urinary tract infection with hematuria, site unspecified     Discharge Instructions     Take the antibiotic as directed.  The urine culture is pending.  We will call you if it shows the need to change or discontinue your antibiotic.    Follow up with your primary care provider if your symptoms are not improving.       ED Prescriptions    Medication Sig Dispense Auth. Provider   cephALEXin (KEFLEX) 500 MG capsule Take 1 capsule (500 mg total) by mouth 2 (two) times daily for 5 days. 10 capsule Sharion Balloon, NP     PDMP not reviewed this encounter.   Sharion Balloon, NP 12/25/20 [phone]

## 2020-12-25 NOTE — Discharge Instructions (Signed)
Take the antibiotic as directed.  The urine culture is pending.  We will call you if it shows the need to change or discontinue your antibiotic.    Follow up with your primary care provider if your symptoms are not improving.    

## 2020-12-25 NOTE — ED Triage Notes (Signed)
Patient c/o hematuria and dysuria x 2 days.   Patient endorses increased frequency and burning with urination.   Patient endorses ABD pressure.   Patient endorses back pain upon onset of symptoms.   Patient endorses some generalized weakness.   Patient tried cranberry pills w/ no relief of symptoms.

## 2020-12-27 LAB — URINE CULTURE: Culture: >=100000 — AB

## 2021-03-19 ENCOUNTER — Ambulatory Visit: Payer: Self-pay

## 2021-03-19 NOTE — Telephone Encounter (Signed)
Will forward to provider  

## 2021-03-19 NOTE — Telephone Encounter (Signed)
If he does not have insurance he will need to pay out of pocket for a specialist. If he is willing to do this I will place a referral to an eye doctor first.

## 2021-03-19 NOTE — Telephone Encounter (Signed)
Pt. Reports 2 weeks ago he started seeing dots in both eyes. Constant. Can still see to read. Has headaches that come and go. Still having ringing in his ears as well. Feels like he needs to see a neurologist. Please advise pt.  Answer Assessment - Initial Assessment Questions 1. DESCRIPTION: "What is the vision loss like? Describe it for me." (e.g., complete vision loss, blurred vision, double vision, floaters, etc.)     Dots 2. LOCATION: "One or both eyes?" If one, ask: "Which eye?"     Both 3. SEVERITY: "Can you see anything?" If Yes, ask: "What can you see?" (e.g., fine print)     Yes - can see 4. ONSET: "When did this begin?" "Did it start suddenly or has this been gradual?"     2 weeks ago 5. PATTERN: "Does this come and go, or has it been constant since it started?"     Constant 6. PAIN: "Is there any pain in your eye(s)?"  (Scale 1-10; or mild, moderate, severe)     No 7. CONTACTS-GLASSES: "Do you wear contacts or glasses?"     No 8. CAUSE: "What do you think is causing this visual problem?"     Unsure 9. OTHER SYMPTOMS: "Do you have any other symptoms?" (e.g., confusion, headache, arm or leg weakness, speech problems)     Headache - comes and goes 10. PREGNANCY: "Is there any chance you are pregnant?" "When was your last menstrual period?"       n/a  Protocols used: Arp

## 2021-03-20 ENCOUNTER — Other Ambulatory Visit: Payer: Self-pay | Admitting: Nurse Practitioner

## 2021-03-20 DIAGNOSIS — H539 Unspecified visual disturbance: Secondary | ICD-10-CM

## 2021-03-20 NOTE — Telephone Encounter (Signed)
Contacted pt to go over provider response. Pt states he is in the process of applying for the OC/[employer]. Pt states he is okay with paying out of pocket. Made pt aware that I will let provider know and once the referral has been submitted they will call him

## 2021-03-31 ENCOUNTER — Other Ambulatory Visit: Payer: Self-pay | Admitting: Nurse Practitioner

## 2021-03-31 ENCOUNTER — Encounter: Payer: Self-pay | Admitting: Nurse Practitioner

## 2021-03-31 DIAGNOSIS — G43709 Chronic migraine without aura, not intractable, without status migrainosus: Secondary | ICD-10-CM

## 2021-03-31 DIAGNOSIS — H9313 Tinnitus, bilateral: Secondary | ICD-10-CM

## 2021-03-31 DIAGNOSIS — H539 Unspecified visual disturbance: Secondary | ICD-10-CM

## 2021-04-14 ENCOUNTER — Ambulatory Visit: Payer: Self-pay | Admitting: *Deleted

## 2021-04-14 ENCOUNTER — Other Ambulatory Visit: Payer: Self-pay | Admitting: Nurse Practitioner

## 2021-04-14 NOTE — Telephone Encounter (Signed)
Pt states he needs referral for ENT or Opthamologist first. Neurology referral denied.

## 2021-04-14 NOTE — Telephone Encounter (Signed)
Pt calling to report referral to neurologist denied, states needs referral to ENT or Opthalmologist first. Transferred to triage when pt mentioned increased headache. Pt reports "Migraine like" headache 2 days ago, not present today, resolved on own. States at "Back, left bottom of head." Reports floaters in both eyes "But I've had those for a year." Denies nausea, no other symptoms associated with headache. Does not check BP. Reports Mild SOB with exertion only "Only if I move at a faster pace then I usually do." Onset today.  Denies cough. States H/O asthma. Assured NT would route to practice for PCPs review.  Advised UC/ED if symptoms worsen, headache reoccurs. Pt verbalizes understanding.   Please advise: [phone] Reason for Disposition . Headache is a chronic symptom (recurrent or ongoing AND present > 4 weeks)  Answer Assessment - Initial Assessment Questions 1. LOCATION: "Where does it hurt?"      "BAck left, bottom of head" 2. ONSET: "When did the headache start?" (Minutes, hours or days)      2 days ago 3. PATTERN: "Does the pain come and go, or has it been constant since it started?"     Gone today, was conatant 4. SEVERITY: "How bad is the pain?" and "What does it keep you from doing?"  (e.g., Scale 1-10; mild, moderate, or severe)   - MILD (1-3): doesn't interfere with normal activities    - MODERATE (4-7): interferes with normal activities or awakens from sleep    - SEVERE (8-10): excruciating pain, unable to do any normal activities        9/10 5. RECURRENT SYMPTOM: "Have you ever had headaches before?" If Yes, ask: "When was the last time?" and "What happened that time?"      Yes, migraines for 1 year. 6. CAUSE: "What do you think is causing the headache?"     Migraine  7. MIGRAINE: "Have you been diagnosed with migraine headaches?" If Yes, ask: "Is this headache similar?"      yes 8. HEAD INJURY: "Has there been any recent injury to the head?"      no 9. OTHER SYMPTOMS:  "Do you have any other symptoms?" (fever, stiff neck, eye pain, sore throat, cold symptoms)     Eye floaters "But I've had them."  Protocols used: HEADACHE-A-AH

## 2021-04-14 NOTE — Telephone Encounter (Signed)
Please have the front desk schedule him with Dr. Erik Obey first. He is uninsured. Thank you.

## 2021-04-15 NOTE — Telephone Encounter (Signed)
Hey Mallory can you schedule pt an appointment with Dr.Wolicki per Zelda. Thanks!

## 2021-04-15 NOTE — Telephone Encounter (Signed)
Called patient and LVM on mobile number (call couldn't be completed on home) advising patient I was calling from [facility] in regards to getting him scheduled for an appointment. Advised patient to call [phone] to schedule an appointment.   If patient calls back please transfer call to office so patient can be placed on Dr. Noreene Filbert schedule.

## 2021-04-18 ENCOUNTER — Encounter: Payer: Self-pay | Admitting: Otolaryngology

## 2021-04-18 ENCOUNTER — Ambulatory Visit: Payer: Self-pay | Attending: Otolaryngology | Admitting: Otolaryngology

## 2021-04-18 ENCOUNTER — Other Ambulatory Visit: Payer: Self-pay

## 2021-04-18 VITALS — BP 121/66 | HR 76 | Ht 75.0 in | Wt 208.6 lb

## 2021-04-18 DIAGNOSIS — F418 Other specified anxiety disorders: Secondary | ICD-10-CM | POA: Insufficient documentation

## 2021-04-18 DIAGNOSIS — H409 Unspecified glaucoma: Secondary | ICD-10-CM

## 2021-04-18 DIAGNOSIS — H9313 Tinnitus, bilateral: Secondary | ICD-10-CM

## 2021-04-18 DIAGNOSIS — G4486 Cervicogenic headache: Secondary | ICD-10-CM | POA: Insufficient documentation

## 2021-04-18 DIAGNOSIS — K0510 Chronic gingivitis, plaque induced: Secondary | ICD-10-CM | POA: Insufficient documentation

## 2021-04-18 NOTE — Patient Instructions (Signed)
You have an assortment of complaints including tinnitus, balance problems, occipital headache, and elevated intraocular pressures.  You have a fair degree of anxiety about the several complaints.  I will order a CT scan of your head to make sure you do not have a brain tumor.  I will call you with these results.  You may still need to see a neurologist at some point.   I will order hearing testing to assess the ringing.  I do think you need to follow-up with your ophthalmologist regarding your eye pressure.  You have severe dental disease.  We will try to help set you up with a dental clinic.  Recheck here as needed.

## 2021-04-18 NOTE — Progress Notes (Signed)
Chief complaint: Tinnitus  History: 30 year old black male has had episodic high-pitched tinnitus in both ears for several years.  Otherwise his hearing seems quite acute.  No significant noise exposure history.  He does not recall having had previous hearing test.    He reports some balance issues lately but cannot be more specific.  He has some left occipital headaches which are not tender to palpation nor affected by head or neck motion.  He does not think he has had a whiplash injury.  He was referred to a neurologist but they requested otolaryngology and ophthalmology consults before they would see him.  He has a family history of glaucoma and had his pressure tested and there was reportedly elevated.  He has not followed up for a number of months.  Examination: He is thin and healthy-appearing.  He is quite anxious about his several health symptoms.  He does not appear distressed.  Mental status is appropriate.  The head is atraumatic and neck supple.  Cranial nerves intact.  Ear canals are clear with normal drums.  Anterior nose is moist and patent.  Oral cavity has severe periodontal disease with hyperplastic gingivitis and halitosis.  Oropharynx is clear.  Neck unremarkable.  No TMJ tenderness or crepitus.  Impression: Occipital headaches, probably muscle tension.  Intermittent tinnitus.  Nonspecific balance disorders.  Elevated intraocular pressures.  Anxiety.  Plan: I will check audiometry.  I will check a noncontrast CT scan of the head to try to allay his anxiety about a brain tumor.  I do think he needs to follow-up with his ophthalmologist.  I suspect he will still need a neurologist depending on the results of the CT scan.  Return visit here as needed.

## 2021-04-18 NOTE — Progress Notes (Signed)
Has ringing in ear, off balance and having headaches.

## 2021-05-05 ENCOUNTER — Encounter: Payer: Self-pay | Admitting: Nurse Practitioner

## 2021-05-06 ENCOUNTER — Other Ambulatory Visit: Payer: Self-pay | Admitting: Nurse Practitioner

## 2021-05-06 DIAGNOSIS — R195 Other fecal abnormalities: Secondary | ICD-10-CM

## 2021-05-06 NOTE — Telephone Encounter (Signed)
Lm on vm for patient to return call to schedule appt.

## 2021-05-07 NOTE — Telephone Encounter (Signed)
Spoke with pt, pt reports lower back pain and SOB. Pt scheduled for follow up appt. Pt will see MD July 14,2022 Thursday at 8:10am. No other concerns at this time.

## 2021-06-19 ENCOUNTER — Ambulatory Visit: Payer: Medicaid Other | Admitting: Gastroenterology

## 2021-08-08 IMAGING — DX DG CHEST 2V
2 series · 2 of 2 positions shown · non-contrast
Comparison: None.

CLINICAL DATA: Chest pain.

EXAM:
CHEST - 2 VIEW

[chest pa]
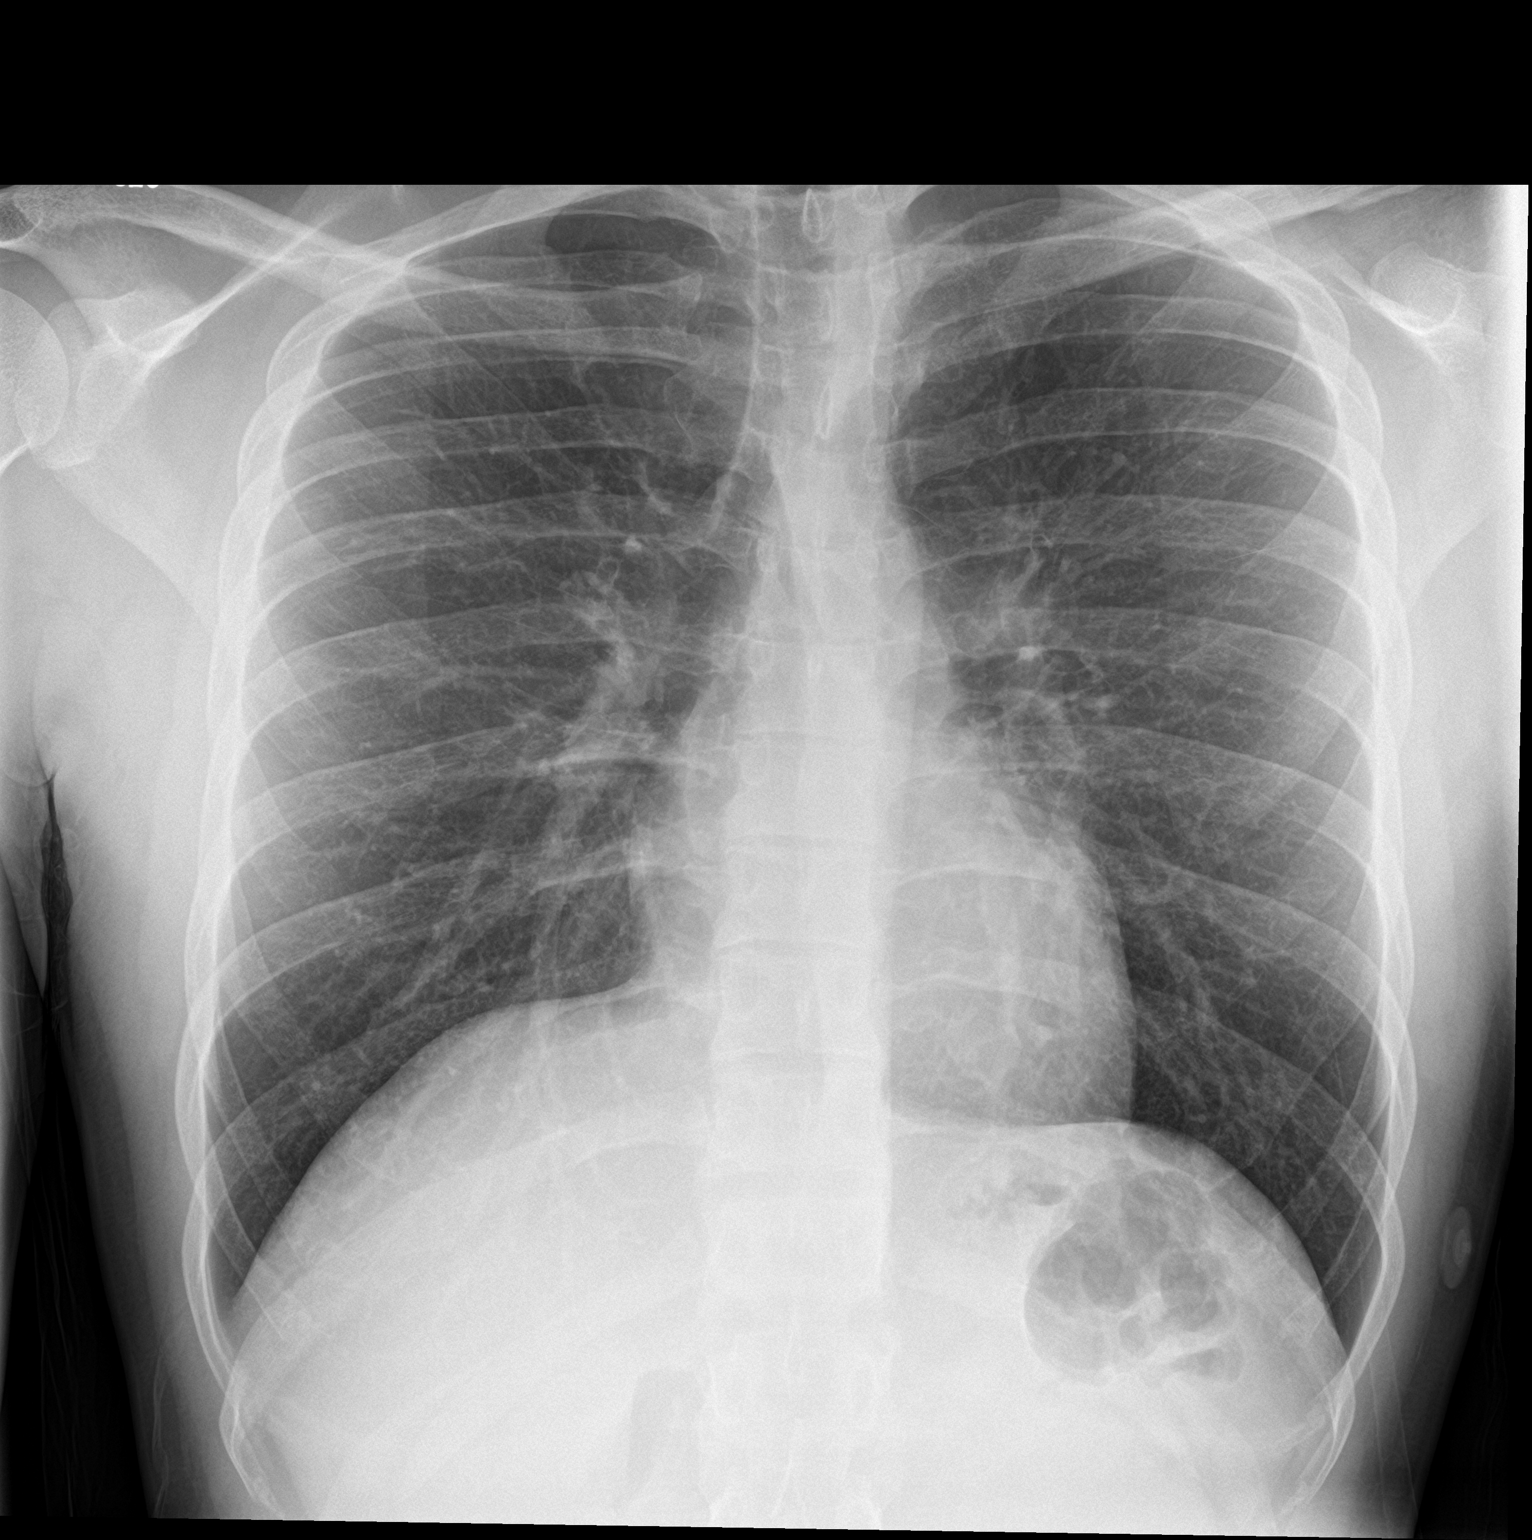

[chest lat]
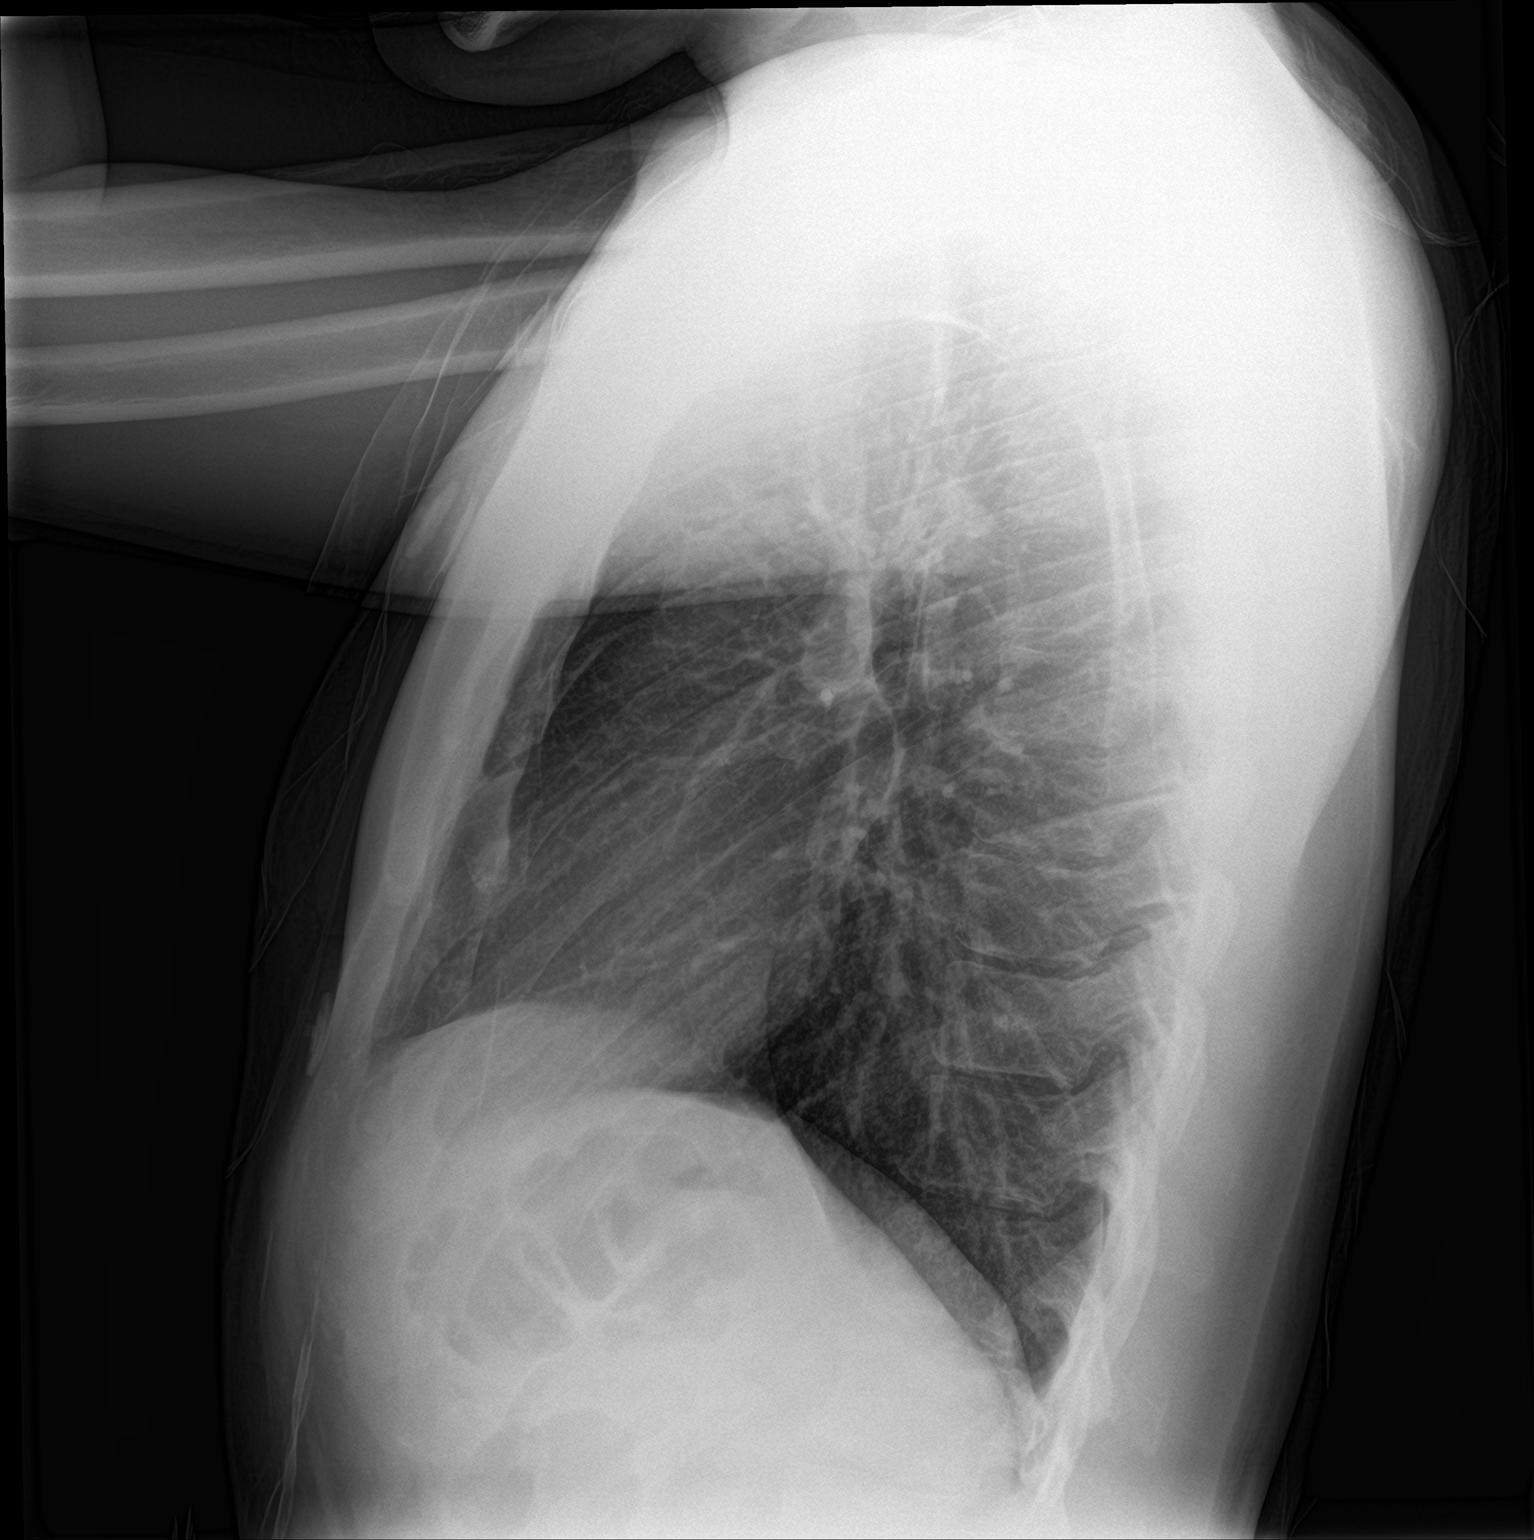

[2 of 2 positions shown; findings below may reference images not displayed]

FINDINGS: The heart size and mediastinal contours are within normal limits.
The lungs are clear. The visualized skeletal structures are
unremarkable.
IMPRESSION: Negative chest.

## 2022-03-16 ENCOUNTER — Other Ambulatory Visit (HOSPITAL_COMMUNITY): Payer: Self-pay

## 2022-04-27 ENCOUNTER — Other Ambulatory Visit (HOSPITAL_COMMUNITY): Payer: Self-pay

## 2022-05-15 ENCOUNTER — Other Ambulatory Visit (HOSPITAL_COMMUNITY): Payer: Self-pay

## 2022-07-20 ENCOUNTER — Other Ambulatory Visit (HOSPITAL_COMMUNITY): Payer: Self-pay

## 2022-12-09 ENCOUNTER — Other Ambulatory Visit: Payer: Self-pay | Admitting: Family Medicine

## 2022-12-09 DIAGNOSIS — R519 Headache, unspecified: Secondary | ICD-10-CM

## 2023-03-03 ENCOUNTER — Encounter: Payer: Self-pay | Admitting: Family Medicine

## 2023-03-04 ENCOUNTER — Ambulatory Visit
Admission: RE | Admit: 2023-03-04 | Discharge: 2023-03-04 | Disposition: A | Discharge status: Home / Self Care | Payer: Medicaid Other | Source: Ambulatory Visit | Attending: Family Medicine | Admitting: Family Medicine

## 2023-03-04 DIAGNOSIS — R519 Headache, unspecified: Secondary | ICD-10-CM

## 2023-04-22 ENCOUNTER — Other Ambulatory Visit (HOSPITAL_COMMUNITY): Payer: Self-pay

## 2024-03-07 ENCOUNTER — Ambulatory Visit (INDEPENDENT_AMBULATORY_CARE_PROVIDER_SITE_OTHER)

## 2024-03-07 ENCOUNTER — Ambulatory Visit (HOSPITAL_COMMUNITY)
Admission: EM | Admit: 2024-03-07 | Discharge: 2024-03-07 | Disposition: A | Discharge status: Home / Self Care | Attending: Family Medicine | Admitting: Family Medicine

## 2024-03-07 ENCOUNTER — Encounter (HOSPITAL_COMMUNITY): Payer: Self-pay

## 2024-03-07 DIAGNOSIS — M79602 Pain in left arm: Secondary | ICD-10-CM | POA: Diagnosis not present

## 2024-03-07 DIAGNOSIS — M25512 Pain in left shoulder: Secondary | ICD-10-CM

## 2024-03-07 DIAGNOSIS — K5900 Constipation, unspecified: Secondary | ICD-10-CM | POA: Diagnosis not present

## 2024-03-07 MED ORDER — NAPROXEN 500 MG PO TABS: 500.0000 mg | ORAL_TABLET | Freq: Two times a day (BID) | ORAL | 0 refills | Status: DC

## 2024-03-07 MED ORDER — NAPROXEN 500 MG PO TABS: 500.0000 mg | ORAL_TABLET | Freq: Two times a day (BID) | ORAL | 0 refills | Status: AC

## 2024-03-07 MED ORDER — LACTULOSE 10 GM/15ML PO SOLN: 20.0000 g | Freq: Every day | ORAL | 0 refills | Status: AC

## 2024-03-07 MED ORDER — LACTULOSE 10 GM/15ML PO SOLN: 20.0000 g | Freq: Every day | ORAL | 0 refills | Status: DC

## 2024-03-07 NOTE — Discharge Instructions (Addendum)
 You were seen today for various issues.  Your xray does show constipation, but no other serious concerns.  If the radiologist reads this differently we will call to notify you.  I have sent out a stronger medication for your constipation.  Please get plenty of fluids as well.  If this is not improving with the medication then you will need to go to the ER for further treatment.  Your arm pain appears muscular.  I have sent out an anti-inflammatory.  I recommend you use a heating pad as needed, and follow up with your primary care provider for this if this continues.

## 2024-03-07 NOTE — ED Triage Notes (Signed)
 Patient here today with c/o constipation X 5 days. He has tried taking Miralax with no relief.   Patient is also having left arm pain X 2 days. Pain comes and goes and sometime in the top part of his shoulder and sometimes in his triceps area.

## 2024-03-07 NOTE — ED Provider Notes (Signed)
 [facility]    CSN: [id number] Arrival date & time: 03/07/24  6269      History   Chief Complaint Chief Complaint  Patient presents with   Constipation   Arm Pain    HPI Keith Newman is a 33 y.o. male.    Constipation Arm Pain  Patient is here for several issues  He is having constipation.  No BM x 5 days.  Prior to that having normal Bms.  No abd pain, but more pressure.  If he bends/moves he feels pain.  He tried mirilax yesterday without much help.   He is also left arm pain x 2 days.  Located at the tricep, the upper back/back of the shoulder.  Pain comes/goes.  Today he had a stabbing pain at the top of the shoulder when tried to put his shoes on.  The tricep pain comes/goes randomly.  No numbness/tingling.  No injury.  No increased activity lately.        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 Authorizing Provider  albuterol (VENTOLIN HFA) 108 (90 Base) MCG/ACT inhaler Inhale 2 puffs into the lungs every 6 (six) hours as needed for wheezing or shortness of breath. Patient not taking: Reported on 03/07/2024 02/29/20   Cain Saupe, MD  hydrOXYzine (ATARAX/VISTARIL) 25 MG tablet Take 1 tablet (25 mg total) by mouth every 8 (eight) hours as needed for anxiety. 04/13/20   Gloris Manchester, MD    Family History Family History  Problem Relation Age of Onset   Diabetes Mother    Hypertension Mother    Prostate cancer Paternal Uncle    Leukemia Maternal Grandmother    Colon cancer  Other    Heart attack Neg Hx    Stroke Neg Hx     Social History Social History   Tobacco Use   Smoking status: Never   Smokeless tobacco: Never  Vaping Use   Vaping status: Never Used  Substance Use Topics   Alcohol use: Not Currently   Drug use: Not Currently     Allergies   Patient has no known allergies.   Review of Systems Review of Systems  Constitutional: Negative.   HENT: Negative.    Respiratory: Negative.    Cardiovascular: Negative.   Gastrointestinal:  Positive for constipation.  Musculoskeletal:  Positive for myalgias.     Physical Exam Triage Vital Signs ED Triage Vitals  Encounter Vitals Group     BP 03/07/24 0957 116/81     Systolic BP Percentile --      Diastolic BP Percentile --      Pulse Rate 03/07/24 0957 71     Resp 03/07/24 0957 16     Temp 03/07/24 0957 97.8 F (36.6 C)     Temp Source 03/07/24 0957 Oral     SpO2 03/07/24 0957 96 %     Weight 03/07/24 0958 190 lb (86.2 kg)     Height 03/07/24 0958  6\' 3"  (1.905 m)     Head Circumference --      Peak Flow --      Pain Score 03/07/24 0959 9     Pain Loc --      Pain Education --      Exclude from Growth Chart --    No data found.  Updated Vital Signs BP 116/81 (BP Location: Left Arm)   Pulse 71   Temp 97.8 F (36.6 C) (Oral)   Resp 16   Ht 6\' 3"  (1.905 m)   Wt 86.2 kg   SpO2 96%   BMI 23.75 kg/m   Visual Acuity Right Eye Distance:   Left Eye Distance:   Bilateral Distance:    Right Eye Near:   Left Eye Near:    Bilateral Near:     Physical Exam Constitutional:      General: He is not in acute distress.    Appearance: Normal appearance. He is normal weight. He is not ill-appearing or toxic-appearing.  Cardiovascular:     Rate and Rhythm: Normal rate and regular rhythm.  Pulmonary:     Effort: Pulmonary effort is normal.     Breath sounds: Normal breath sounds.  Abdominal:     Palpations: Abdomen is soft.     Tenderness: There is abdominal tenderness. There  is no guarding or rebound.     Comments: Mild diffuse tenderness  Musculoskeletal:     Cervical back: Normal range of motion and neck supple. No tenderness.     Comments: No spinous tenderness;  slight TTP to the left upper back/scapular area that radiates to the neck;  full rom of the neck without pain or limitation;  full rom of the left shoulder without pain or limitation  Skin:    General: Skin is warm.  Neurological:     General: No focal deficit present.     Mental Status: He is alert.  Psychiatric:        Mood and Affect: Mood normal.      UC Treatments / Results  Labs (all labs ordered are listed, but only abnormal results are displayed) Labs Reviewed - No data to display  EKG   Radiology No results found.  Procedures Procedures (including critical care time)  Medications Ordered in UC Medications - No data to display  Initial Impression / Assessment and Plan / UC Course  I have reviewed the triage vital signs and the nursing notes.  Pertinent labs & imaging results that were available during my care of the patient were reviewed by me and considered in my medical decision making (see chart for details).   Final Clinical Impressions(s) / UC Diagnoses   Final diagnoses:  Constipation, unspecified constipation type  Left arm pain  Acute pain of left shoulder     Discharge Instructions      You were seen today for various issues.  Your xray does show constipation, but no other serious concerns.  If the radiologist reads this differently we will call to notify you.  I have sent out a stronger medication for your constipation.  Please get plenty of fluids as well.  If this is not improving with the medication then you will need to go to the ER for further treatment.  Your arm pain appears muscular.  I have sent out an anti-inflammatory.  I recommend you use a heating pad as needed, and follow up with your primary care provider for this if this continues.  ED Prescriptions     Medication Sig Dispense Auth. Provider   lactulose (CHRONULAC) 10 GM/15ML solution  (Status: Discontinued) Take 30 mLs (20 g total) by mouth daily. 236 mL Jennamarie Goings, MD   naproxen (NAPROSYN) 500 MG tablet  (Status: Discontinued) Take 1 tablet (500 mg total) by mouth 2 (two) times daily. 30 tablet Vanda Waskey, MD   lactulose (CHRONULAC) 10 GM/15ML solution Take 30 mLs (20 g total) by mouth daily. 236 mL Farzad Tibbetts, MD   naproxen (NAPROSYN) 500 MG tablet Take 1 tablet (500 mg total) by mouth 2 (two) times daily. 30 tablet Jannifer Franklin, MD      PDMP not reviewed this encounter.   Jannifer Franklin, MD 03/07/24 1040
